# Patient Record
Sex: Female | Born: 1985 | Race: Black or African American | Hispanic: No | Marital: Single | Smoking: Never smoker
Health system: Southern US, Community
[De-identification: ages and names within clinical notes are randomized; demographics above are authoritative.]

## PROBLEM LIST (undated history)

## (undated) DIAGNOSIS — R51 Headache: Secondary | ICD-10-CM

## (undated) DIAGNOSIS — A6000 Herpesviral infection of urogenital system, unspecified: Secondary | ICD-10-CM

## (undated) DIAGNOSIS — T7840XA Allergy, unspecified, initial encounter: Secondary | ICD-10-CM

## (undated) DIAGNOSIS — D649 Anemia, unspecified: Secondary | ICD-10-CM

## (undated) HISTORY — DX: Allergy, unspecified, initial encounter: T78.40XA

## (undated) HISTORY — PX: ABDOMINAL SURGERY: SHX537

---

## 2012-12-29 LAB — OB RESULTS CONSOLE HEPATITIS B SURFACE ANTIGEN: Hepatitis B Surface Ag: NEGATIVE

## 2012-12-29 LAB — OB RESULTS CONSOLE ABO/RH: RH Type: POSITIVE

## 2012-12-29 LAB — OB RESULTS CONSOLE RUBELLA ANTIBODY, IGM: Rubella: IMMUNE

## 2012-12-29 LAB — OB RESULTS CONSOLE RPR: RPR: NONREACTIVE

## 2012-12-29 LAB — OB RESULTS CONSOLE HIV ANTIBODY (ROUTINE TESTING): HIV: NONREACTIVE

## 2013-07-06 ENCOUNTER — Inpatient Hospital Stay (HOSPITAL_COMMUNITY): Payer: Medicaid Other

## 2013-07-06 ENCOUNTER — Encounter (HOSPITAL_COMMUNITY): Payer: Self-pay | Admitting: *Deleted

## 2013-07-06 ENCOUNTER — Inpatient Hospital Stay (HOSPITAL_COMMUNITY)
Admission: AD | Admit: 2013-07-06 | Discharge: 2013-07-06 | Disposition: A | Discharge status: Home / Self Care | Payer: Medicaid Other | Source: Ambulatory Visit | Attending: Obstetrics and Gynecology | Admitting: Obstetrics and Gynecology

## 2013-07-06 DIAGNOSIS — E049 Nontoxic goiter, unspecified: Secondary | ICD-10-CM | POA: Diagnosis present

## 2013-07-06 DIAGNOSIS — O368390 Maternal care for abnormalities of the fetal heart rate or rhythm, unspecified trimester, not applicable or unspecified: Secondary | ICD-10-CM | POA: Insufficient documentation

## 2013-07-06 DIAGNOSIS — D649 Anemia, unspecified: Secondary | ICD-10-CM | POA: Diagnosis present

## 2013-07-06 DIAGNOSIS — B009 Herpesviral infection, unspecified: Secondary | ICD-10-CM | POA: Diagnosis present

## 2013-07-06 HISTORY — DX: Herpesviral infection of urogenital system, unspecified: A60.00

## 2013-07-06 HISTORY — DX: Headache: R51

## 2013-07-06 HISTORY — DX: Anemia, unspecified: D64.9

## 2013-07-06 LAB — URINALYSIS, ROUTINE W REFLEX MICROSCOPIC
Glucose, UA: NEGATIVE mg/dL
Hgb urine dipstick: NEGATIVE
Ketones, ur: NEGATIVE mg/dL
Protein, ur: NEGATIVE mg/dL
Specific Gravity, Urine: <1.005 — ABNORMAL LOW (ref 1.005–1.030)
Urobilinogen, UA: 0.2 mg/dL (ref 0.0–1.0)
pH: 6.5 (ref 5.0–8.0)

## 2013-07-06 LAB — OB RESULTS CONSOLE GBS: GBS: POSITIVE

## 2013-07-06 NOTE — MAU Note (Signed)
Sent from OB's office for further EFM due to fetal tachycardia

## 2013-08-07 ENCOUNTER — Encounter (HOSPITAL_COMMUNITY): Payer: Self-pay | Admitting: *Deleted

## 2013-08-07 ENCOUNTER — Inpatient Hospital Stay (HOSPITAL_COMMUNITY)
Admission: AD | Admit: 2013-08-07 | Discharge: 2013-08-10 | DRG: 765 | Disposition: A | Discharge status: Home / Self Care | Payer: Medicaid Other | Source: Ambulatory Visit | Attending: Obstetrics and Gynecology | Admitting: Obstetrics and Gynecology

## 2013-08-07 ENCOUNTER — Inpatient Hospital Stay (HOSPITAL_COMMUNITY): Payer: Medicaid Other

## 2013-08-07 DIAGNOSIS — O9902 Anemia complicating childbirth: Secondary | ICD-10-CM | POA: Diagnosis present

## 2013-08-07 DIAGNOSIS — O98519 Other viral diseases complicating pregnancy, unspecified trimester: Secondary | ICD-10-CM | POA: Diagnosis present

## 2013-08-07 DIAGNOSIS — A6000 Herpesviral infection of urogenital system, unspecified: Secondary | ICD-10-CM | POA: Diagnosis present

## 2013-08-07 DIAGNOSIS — O139 Gestational [pregnancy-induced] hypertension without significant proteinuria, unspecified trimester: Principal | ICD-10-CM | POA: Diagnosis present

## 2013-08-07 DIAGNOSIS — Z22330 Carrier of Group B streptococcus: Secondary | ICD-10-CM

## 2013-08-07 DIAGNOSIS — E669 Obesity, unspecified: Secondary | ICD-10-CM | POA: Diagnosis present

## 2013-08-07 DIAGNOSIS — O99892 Other specified diseases and conditions complicating childbirth: Secondary | ICD-10-CM | POA: Diagnosis present

## 2013-08-07 DIAGNOSIS — D649 Anemia, unspecified: Secondary | ICD-10-CM | POA: Diagnosis present

## 2013-08-07 LAB — CBC
HCT: 35 % — ABNORMAL LOW (ref 36.0–46.0)
MCH: 25 pg — ABNORMAL LOW (ref 26.0–34.0)
MCH: 24.9 pg — ABNORMAL LOW (ref 26.0–34.0)
MCHC: 32.6 g/dL (ref 30.0–36.0)
MCHC: 32.1 g/dL (ref 30.0–36.0)
MCV: 76.8 fL — ABNORMAL LOW (ref 78.0–100.0)
MCV: 77.7 fL — ABNORMAL LOW (ref 78.0–100.0)
Platelets: 180 10*3/uL (ref 150–400)
Platelets: 206 10*3/uL (ref 150–400)
RBC: 4.56 MIL/uL (ref 3.87–5.11)
RDW: 17.2 % — ABNORMAL HIGH (ref 11.5–15.5)
WBC: 10.6 10*3/uL — ABNORMAL HIGH (ref 4.0–10.5)

## 2013-08-07 LAB — COMPREHENSIVE METABOLIC PANEL
BUN: 9 mg/dL (ref 6–23)
CO2: 20 mEq/L (ref 19–32)
Calcium: 8.9 mg/dL (ref 8.4–10.5)
Chloride: 102 mEq/L (ref 96–112)
Creatinine, Ser: 0.63 mg/dL (ref 0.50–1.10)
GFR calc Af Amer: >90 mL/min (ref >90)
GFR calc non Af Amer: >90 mL/min (ref >90)
Glucose, Bld: 114 mg/dL — ABNORMAL HIGH (ref 70–99)
Total Bilirubin: 0.2 mg/dL — ABNORMAL LOW (ref 0.3–1.2)

## 2013-08-07 LAB — PROTEIN / CREATININE RATIO, URINE
Creatinine, Urine: 86.81 mg/dL
Protein Creatinine Ratio: 0.22 — ABNORMAL HIGH (ref 0.00–0.15)

## 2013-08-07 LAB — LACTATE DEHYDROGENASE: LDH: 180 U/L (ref 94–250)

## 2013-08-07 LAB — RPR: RPR Ser Ql: NONREACTIVE

## 2013-08-07 LAB — ABO/RH: ABO/RH(D): B POS

## 2013-08-07 LAB — URIC ACID: Uric Acid, Serum: 3.6 mg/dL (ref 2.4–7.0)

## 2013-08-07 LAB — TYPE AND SCREEN

## 2013-08-07 MED ORDER — IBUPROFEN 600 MG PO TABS: 600.0000 mg | ORAL_TABLET | Freq: Four times a day (QID) | ORAL | Status: DC | PRN

## 2013-08-07 MED ORDER — PHENYLEPHRINE 40 MCG/ML (10ML) SYRINGE FOR IV PUSH (FOR BLOOD PRESSURE SUPPORT): 80.0000 ug | PREFILLED_SYRINGE | INTRAVENOUS | Status: DC | PRN

## 2013-08-07 MED ORDER — CITRIC ACID-SODIUM CITRATE 334-500 MG/5ML PO SOLN
30.0000 mL | ORAL | Status: DC | PRN
  Administered 2013-08-08: 30 mL via ORAL
  Filled 2013-08-07: qty 15

## 2013-08-07 MED ORDER — PENICILLIN G POTASSIUM 5000000 UNITS IJ SOLR
2.5000 10*6.[IU] | INTRAMUSCULAR | Status: DC
  Administered 2013-08-07 – 2013-08-08 (×6): 2.5 10*6.[IU] via INTRAVENOUS
  Filled 2013-08-07 (×11): qty 2.5

## 2013-08-07 MED ORDER — OXYTOCIN 40 UNITS IN LACTATED RINGERS INFUSION - SIMPLE MED
62.5000 mL/h | INTRAVENOUS | Status: DC
  Filled 2013-08-07: qty 1000

## 2013-08-07 MED ORDER — ACETAMINOPHEN 325 MG PO TABS: 650.0000 mg | ORAL_TABLET | ORAL | Status: DC | PRN

## 2013-08-07 MED ORDER — EPHEDRINE 5 MG/ML INJ: 10.0000 mg | INTRAVENOUS | Status: DC | PRN

## 2013-08-07 MED ORDER — PROMETHAZINE HCL 25 MG/ML IJ SOLN
25.0000 mg | Freq: Once | INTRAMUSCULAR | Status: AC
  Administered 2013-08-07: 25 mg via INTRAVENOUS
  Filled 2013-08-07: qty 1

## 2013-08-07 MED ORDER — PHENYLEPHRINE 40 MCG/ML (10ML) SYRINGE FOR IV PUSH (FOR BLOOD PRESSURE SUPPORT)
80.0000 ug | PREFILLED_SYRINGE | INTRAVENOUS | Status: DC | PRN
  Filled 2013-08-07: qty 10

## 2013-08-07 MED ORDER — OXYTOCIN BOLUS FROM INFUSION: 500.0000 mL | INTRAVENOUS | Status: DC

## 2013-08-07 MED ORDER — ZOLPIDEM TARTRATE 5 MG PO TABS: 5.0000 mg | ORAL_TABLET | Freq: Every evening | ORAL | Status: DC | PRN

## 2013-08-07 MED ORDER — LACTATED RINGERS IV SOLN
INTRAVENOUS | Status: DC
  Administered 2013-08-07 – 2013-08-08 (×4): via INTRAVENOUS

## 2013-08-07 MED ORDER — OXYCODONE-ACETAMINOPHEN 5-325 MG PO TABS: 1.0000 | ORAL_TABLET | ORAL | Status: DC | PRN

## 2013-08-07 MED ORDER — NALBUPHINE SYRINGE 5 MG/0.5 ML
10.0000 mg | INJECTION | Freq: Once | INTRAMUSCULAR | Status: AC
  Administered 2013-08-07: 10 mg via INTRAVENOUS
  Filled 2013-08-07: qty 1

## 2013-08-07 MED ORDER — LACTATED RINGERS IV SOLN
500.0000 mL | Freq: Once | INTRAVENOUS | Status: AC
  Administered 2013-08-08: 500 mL via INTRAVENOUS

## 2013-08-07 MED ORDER — TERBUTALINE SULFATE 1 MG/ML IJ SOLN: 0.2500 mg | Freq: Once | INTRAMUSCULAR | Status: AC | PRN

## 2013-08-07 MED ORDER — FENTANYL 2.5 MCG/ML BUPIVACAINE 1/10 % EPIDURAL INFUSION (WH - ANES)
14.0000 mL/h | INTRAMUSCULAR | Status: DC | PRN
  Administered 2013-08-08 (×2): 14 mL/h via EPIDURAL
  Filled 2013-08-07 (×2): qty 125

## 2013-08-07 MED ORDER — LACTATED RINGERS IV SOLN
500.0000 mL | INTRAVENOUS | Status: DC | PRN
  Administered 2013-08-08: 500 mL via INTRAVENOUS

## 2013-08-07 MED ORDER — PENICILLIN G POTASSIUM 5000000 UNITS IJ SOLR
5.0000 10*6.[IU] | Freq: Once | INTRAVENOUS | Status: AC
  Administered 2013-08-07: 5 10*6.[IU] via INTRAVENOUS
  Filled 2013-08-07: qty 5

## 2013-08-07 MED ORDER — MISOPROSTOL 25 MCG QUARTER TABLET
25.0000 ug | ORAL_TABLET | ORAL | Status: DC | PRN
  Administered 2013-08-07 (×2): 25 ug via VAGINAL
  Filled 2013-08-07 (×2): qty 0.25

## 2013-08-07 MED ORDER — DIPHENHYDRAMINE HCL 50 MG/ML IJ SOLN
12.5000 mg | INTRAMUSCULAR | Status: DC | PRN
Start: 2013-08-07 — End: 2013-08-08

## 2013-08-07 MED ORDER — LIDOCAINE HCL (PF) 1 % IJ SOLN: 30.0000 mL | INTRAMUSCULAR | Status: DC | PRN

## 2013-08-07 MED ORDER — BUTORPHANOL TARTRATE 1 MG/ML IJ SOLN
2.0000 mg | INTRAMUSCULAR | Status: DC | PRN
  Administered 2013-08-07 (×2): 2 mg via INTRAVENOUS
  Administered 2013-08-07: 1 mg via INTRAVENOUS
  Filled 2013-08-07 (×3): qty 2

## 2013-08-07 MED ORDER — ONDANSETRON HCL 4 MG/2ML IJ SOLN: 4.0000 mg | Freq: Four times a day (QID) | INTRAMUSCULAR | Status: DC | PRN

## 2013-08-07 MED ORDER — EPHEDRINE 5 MG/ML INJ
10.0000 mg | INTRAVENOUS | Status: DC | PRN
  Filled 2013-08-07: qty 4

## 2013-08-07 MED ORDER — CEFAZOLIN SODIUM 1-5 GM-% IV SOLN: 1.0000 g | Freq: Three times a day (TID) | INTRAVENOUS | Status: DC

## 2013-08-07 MED ORDER — CEFAZOLIN SODIUM-DEXTROSE 2-3 GM-% IV SOLR: 2.0000 g | Freq: Once | INTRAVENOUS | Status: DC

## 2013-08-07 NOTE — Progress Notes (Signed)
  Subjective: Pt is having trouble coping with UCs at this point but would like to delay epidural.  Pt requests IV pain medication.  Objective: BP 146/91  Pulse 93  Temp(Src) 98.7 F (37.1 C) (Oral)  Resp 18  Ht 5\' 5"  (1.651 m)  Wt 211 lb (95.709 kg)  BMI 35.11 kg/m2  SpO2 100%      FHT:  Cat I UC:   irregular, every 2-7 minutes  SVE:   Dilation: 1 Effacement (%): 50 Station: -3 Exam by:: Dr. Stefano Gaul   Assessment / Plan:  Labor: IOL for [facility]; Cytotec given at 0956 and 1359; Foley bulb placed at 1800; Plan to start Pitocin in the am Preeclampsia: BP range (0700 - 1900) 137 - 153 / 81 - 103 Fetal Wellbeing: Cat I Pain Control: Stadol given at 1349 and 1722; Rx'd Nubain 10 mg and Phenergan 25 mg for rest; pt may request epidural after I/D: GBS pos; Pen G prophylaxis initiated at 0941 this am; Intact; Afebrile Anticipated MOD: SVD   Allizon Woznick 08/07/2013, 8:07 PM

## 2013-08-07 NOTE — MAU Note (Signed)
Stronger contractions started around  6pm, 5 mins apart since 9 pm. Denies vaginal bleeding and leaking of fluid.

## 2013-08-07 NOTE — H&P (Addendum)
Admission History and Physical Exam for an Obstetrics Patient  Kristine Mason is a 27 y.o. female, G1P0, at [redacted]w[redacted]d gestation, who presents for evaluation of uterine contractions. She has been followed at the [facility] and Gynecology division of [facility].  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 She is currently taking Valtrex. She denies any symptoms or signs of infection. The patient denies headaches, blurred vision, and right upper quadrant tenderness. See history below.  OB History   Grav Para Term Preterm Abortions TAB SAB Ect Mult Living   1               Past Medical History  Diagnosis Date  . Anemia   . Headache(784.0)   . Herpes genitalia     Prescriptions prior to admission  Medication Sig Dispense Refill  . Iron-FA-B Cmp-C-Biot-Probiotic (FUSION PLUS PO) Take 1 tablet by mouth daily.      . Prenatal Vit-Min-FA-Fish Oil ([employer] PRENATAL GUMMY PO) Take 2 tablets by mouth daily.      . ranitidine (ZANTAC) 75 MG tablet Take 75 mg by mouth daily as needed for heartburn.      . valACYclovir (VALTREX) 500 MG tablet Take 500 mg by mouth daily.        Past Surgical History  Procedure Laterality Date  . Abdominal surgery      Allergies  Allergen Reactions  . Cleocin [Clindamycin Hcl] Rash    Family History: family history includes Cancer in her paternal grandmother; Diabetes in her maternal grandfather; Heart disease in her maternal grandmother and mother; Hypertension in her maternal aunt and maternal grandmother.  Social History:  reports that she has never smoked. She does not have any smokeless tobacco history on file. She reports that she does not drink alcohol or use illicit drugs.  Review of systems: Normal pregnancy complaints.  Admission Physical Exam:  Dilation: Fingertip Effacement (%): Thick Station: -3 Exam by:: L. Munford RN Body mass index is 35.11 kg/(m^2).  Blood pressure 139/87, pulse 89,  temperature 99 F (37.2 C), temperature source Oral, resp. rate 18, height 5\' 5"  (1.651 m), weight 211 lb (95.709 kg), SpO2 100.00%.  HEENT:                 Within normal limits Chest:                   Clear Heart:                    Regular rate and rhythm Abdomen:             Gravid and nontender Extremities:          Grossly normal Neurologic exam: Grossly normal Pelvic exam:         Cervix: Fingertip Speculum exam: The vulva, vagina, and cervix were carefully inspected. No lesions are appreciated.  Prenatal labs: ABO, Rh:             B/Positive/-- (05/09 0000) HBsAg:                 Negative (05/09 0000)  HIV:                       Non-reactive (05/09 0000)  GBS:                       positive Antibody:  Negative (05/09 0000) Rubella:                  immune RPR:                    Nonreactive (05/09 0000)   Results for orders placed during the hospital encounter of 08/07/13 (from the past 24 hour(s))  CBC     Status: Abnormal   Collection Time    08/07/13  3:17 AM      Result Value Range   WBC 10.6 (*) 4.0 - 10.5 K/uL   RBC 4.56  3.87 - 5.11 MIL/uL   Hemoglobin 11.4 (*) 12.0 - 15.0 g/dL   HCT 96.2 (*) 95.2 - 84.1 %   MCV 76.8 (*) 78.0 - 100.0 fL   MCH 25.0 (*) 26.0 - 34.0 pg   MCHC 32.6  30.0 - 36.0 g/dL   RDW 32.4 (*) 40.1 - 02.7 %   Platelets 180  150 - 400 K/uL  COMPREHENSIVE METABOLIC PANEL     Status: Abnormal   Collection Time    08/07/13  3:17 AM      Result Value Range   Sodium 134 (*) 135 - 145 mEq/L   Potassium 3.9  3.5 - 5.1 mEq/L   Chloride 102  96 - 112 mEq/L   CO2 20  19 - 32 mEq/L   Glucose, Bld 114 (*) 70 - 99 mg/dL   BUN 9  6 - 23 mg/dL   Creatinine, Ser 2.53  0.50 - 1.10 mg/dL   Calcium 8.9  8.4 - 66.4 mg/dL   Total Protein 6.4  6.0 - 8.3 g/dL   Albumin 2.7 (*) 3.5 - 5.2 g/dL   AST 18  0 - 37 U/L   ALT 22  0 - 35 U/L   Alkaline Phosphatase 97  39 - 117 U/L   Total Bilirubin 0.2 (*) 0.3 - 1.2 mg/dL   GFR calc non Af Amer >90  >90  mL/min   GFR calc Af Amer >90  >90 mL/min  URIC ACID     Status: None   Collection Time    08/07/13  3:17 AM      Result Value Range   Uric Acid, Serum 3.6  2.4 - 7.0 mg/dL  LACTATE DEHYDROGENASE     Status: None   Collection Time    08/07/13  3:17 AM      Result Value Range   LDH 180  94 - 250 U/L  PROTEIN / CREATININE RATIO, URINE     Status: Abnormal   Collection Time    08/07/13  3:35 AM      Result Value Range   Creatinine, Urine 86.81     Total Protein, Urine 18.8     PROTEIN CREATININE RATIO 0.22 (*) 0.00 - 0.15  TYPE AND SCREEN     Status: None   Collection Time    08/07/13  8:35 AM      Result Value Range   ABO/RH(D) B POS     Antibody Screen NEG     Sample Expiration 08/10/2013    ABO/RH     Status: None   Collection Time    08/07/13  8:35 AM      Result Value Range   ABO/RH(D) B POS    CBC     Status: Abnormal   Collection Time    08/07/13  9:30 AM      Result Value Range   WBC 10.2  4.0 - 10.5 K/uL   RBC 4.70  3.87 - 5.11 MIL/uL   Hemoglobin 11.7 (*) 12.0 - 15.0 g/dL   HCT 16.1  09.6 - 04.5 %   MCV 77.7 (*) 78.0 - 100.0 fL   MCH 24.9 (*) 26.0 - 34.0 pg   MCHC 32.1  30.0 - 36.0 g/dL   RDW 40.9 (*) 81.1 - 91.4 %   Platelets 206  150 - 400 K/uL  RPR     Status: None   Collection Time    08/07/13  9:30 AM      Result Value Range   RPR NON REACTIVE  NON REACTIVE        Prenatal Transfer Tool  Maternal Diabetes: No Genetic Screening: Normal Maternal Ultrasounds/Referrals: Normal Fetal Ultrasounds or other Referrals:  None Maternal Substance Abuse:  No Significant Maternal Medications:  Valtrex Significant Maternal Lab Results:  Positive beta strep  Assessment:  [redacted]w[redacted]d gestation  Pregnancy-induced hypertension. The patient was observed maternity admissions for several hours. She had 2 blood pressures greater than 140/90 that were greater than 4 hours apart. The decision was made to admit the patient for induction because she is already past her  due date with a new diagnosis of PIH.  Herpesvirus (no lesions)   positive beta strep  Anemia  Obesity  Plan:  The patient was admitted for induction. She was given several doses of vaginal Cytotec. There was minimal change of her cervix. A Foley bulb was inserted and we will plan to augment with Pitocin in the morning.  Fetal heart rate tracing is a category 1.   Ashely Joshua V 08/07/2013, 8:55 AM

## 2013-08-08 ENCOUNTER — Encounter (HOSPITAL_COMMUNITY): Payer: Self-pay | Admitting: *Deleted

## 2013-08-08 ENCOUNTER — Encounter (HOSPITAL_COMMUNITY): Payer: Medicaid Other | Admitting: Anesthesiology

## 2013-08-08 ENCOUNTER — Encounter (HOSPITAL_COMMUNITY)
Admission: AD | Disposition: A | Discharge status: Home / Self Care | Payer: Self-pay | Source: Ambulatory Visit | Attending: Obstetrics and Gynecology

## 2013-08-08 ENCOUNTER — Inpatient Hospital Stay (HOSPITAL_COMMUNITY): Payer: Medicaid Other | Admitting: Anesthesiology

## 2013-08-08 LAB — CBC
HCT: 35.3 % — ABNORMAL LOW (ref 36.0–46.0)
Hemoglobin: 11.7 g/dL — ABNORMAL LOW (ref 12.0–15.0)
MCH: 25.7 pg — ABNORMAL LOW (ref 26.0–34.0)
MCHC: 33.1 g/dL (ref 30.0–36.0)
MCV: 77.4 fL — ABNORMAL LOW (ref 78.0–100.0)
RBC: 4.56 MIL/uL (ref 3.87–5.11)

## 2013-08-08 SURGERY — Surgical Case
Anesthesia: Epidural | Site: Abdomen

## 2013-08-08 MED ORDER — LIDOCAINE-EPINEPHRINE (PF) 2 %-1:200000 IJ SOLN
INTRAMUSCULAR | Status: AC
  Filled 2013-08-08: qty 20

## 2013-08-08 MED ORDER — BUPIVACAINE HCL (PF) 0.25 % IJ SOLN
INTRAMUSCULAR | Status: AC
  Filled 2013-08-08: qty 10

## 2013-08-08 MED ORDER — MEPERIDINE HCL 25 MG/ML IJ SOLN: 6.2500 mg | INTRAMUSCULAR | Status: DC | PRN

## 2013-08-08 MED ORDER — FENTANYL CITRATE 0.05 MG/ML IJ SOLN
INTRAMUSCULAR | Status: DC | PRN
  Administered 2013-08-08: 100 ug via INTRAVENOUS

## 2013-08-08 MED ORDER — ONDANSETRON HCL 4 MG/2ML IJ SOLN: 4.0000 mg | Freq: Three times a day (TID) | INTRAMUSCULAR | Status: DC | PRN

## 2013-08-08 MED ORDER — SODIUM BICARBONATE 8.4 % IV SOLN
INTRAVENOUS | Status: AC
  Filled 2013-08-08: qty 50

## 2013-08-08 MED ORDER — IBUPROFEN 600 MG PO TABS
600.0000 mg | ORAL_TABLET | Freq: Four times a day (QID) | ORAL | Status: DC
  Administered 2013-08-08 – 2013-08-10 (×7): 600 mg via ORAL
  Filled 2013-08-08 (×8): qty 1

## 2013-08-08 MED ORDER — BUPIVACAINE HCL (PF) 0.25 % IJ SOLN
INTRAMUSCULAR | Status: DC | PRN
  Administered 2013-08-08: 20 mL

## 2013-08-08 MED ORDER — TERBUTALINE SULFATE 1 MG/ML IJ SOLN: 0.2500 mg | Freq: Once | INTRAMUSCULAR | Status: DC | PRN

## 2013-08-08 MED ORDER — MORPHINE SULFATE 0.5 MG/ML IJ SOLN
INTRAMUSCULAR | Status: AC
  Filled 2013-08-08: qty 10

## 2013-08-08 MED ORDER — DIPHENHYDRAMINE HCL 25 MG PO CAPS
25.0000 mg | ORAL_CAPSULE | ORAL | Status: DC | PRN
  Filled 2013-08-08: qty 1

## 2013-08-08 MED ORDER — BUPIVACAINE HCL (PF) 0.25 % IJ SOLN: INTRAMUSCULAR | Status: DC | PRN

## 2013-08-08 MED ORDER — LACTATED RINGERS IV SOLN
INTRAVENOUS | Status: DC | PRN
  Administered 2013-08-08: 15:00:00 via INTRAVENOUS

## 2013-08-08 MED ORDER — METOCLOPRAMIDE HCL 5 MG/ML IJ SOLN: 10.0000 mg | Freq: Three times a day (TID) | INTRAMUSCULAR | Status: DC | PRN

## 2013-08-08 MED ORDER — NALOXONE HCL 1 MG/ML IJ SOLN
1.0000 ug/kg/h | INTRAVENOUS | Status: DC | PRN
  Filled 2013-08-08: qty 2

## 2013-08-08 MED ORDER — DIPHENHYDRAMINE HCL 50 MG/ML IJ SOLN: 25.0000 mg | INTRAMUSCULAR | Status: DC | PRN

## 2013-08-08 MED ORDER — SODIUM CHLORIDE 0.9 % IJ SOLN: 3.0000 mL | INTRAMUSCULAR | Status: DC | PRN

## 2013-08-08 MED ORDER — OXYTOCIN 10 UNIT/ML IJ SOLN
40.0000 [IU] | INTRAVENOUS | Status: DC | PRN
  Administered 2013-08-08: 40 [IU] via INTRAVENOUS

## 2013-08-08 MED ORDER — FERROUS SULFATE 325 (65 FE) MG PO TABS
325.0000 mg | ORAL_TABLET | Freq: Two times a day (BID) | ORAL | Status: DC
  Administered 2013-08-09 – 2013-08-10 (×3): 325 mg via ORAL
  Filled 2013-08-08 (×3): qty 1

## 2013-08-08 MED ORDER — METHYLERGONOVINE MALEATE 0.2 MG PO TABS: 0.2000 mg | ORAL_TABLET | ORAL | Status: DC | PRN

## 2013-08-08 MED ORDER — ONDANSETRON HCL 4 MG/2ML IJ SOLN: 4.0000 mg | INTRAMUSCULAR | Status: DC | PRN

## 2013-08-08 MED ORDER — SIMETHICONE 80 MG PO CHEW
80.0000 mg | CHEWABLE_TABLET | ORAL | Status: DC | PRN
  Administered 2013-08-09: 80 mg via ORAL

## 2013-08-08 MED ORDER — CEFAZOLIN SODIUM-DEXTROSE 2-3 GM-% IV SOLR
INTRAVENOUS | Status: DC | PRN
  Administered 2013-08-08: 2 g via INTRAVENOUS

## 2013-08-08 MED ORDER — FENTANYL CITRATE 0.05 MG/ML IJ SOLN
INTRAMUSCULAR | Status: AC
  Filled 2013-08-08: qty 2

## 2013-08-08 MED ORDER — SODIUM BICARBONATE 8.4 % IV SOLN
INTRAVENOUS | Status: DC | PRN
  Administered 2013-08-08 (×4): 5 mL via EPIDURAL
  Administered 2013-08-08: 10 mL via EPIDURAL

## 2013-08-08 MED ORDER — MENTHOL 3 MG MT LOZG: 1.0000 | LOZENGE | OROMUCOSAL | Status: DC | PRN

## 2013-08-08 MED ORDER — LANOLIN HYDROUS EX OINT: 1.0000 "application " | TOPICAL_OINTMENT | CUTANEOUS | Status: DC | PRN

## 2013-08-08 MED ORDER — MORPHINE SULFATE (PF) 0.5 MG/ML IJ SOLN
INTRAMUSCULAR | Status: DC | PRN
  Administered 2013-08-08: 4 mg via EPIDURAL
  Administered 2013-08-08: 1 mg via INTRAVENOUS

## 2013-08-08 MED ORDER — WITCH HAZEL-GLYCERIN EX PADS: 1.0000 "application " | MEDICATED_PAD | CUTANEOUS | Status: DC | PRN

## 2013-08-08 MED ORDER — OXYTOCIN 40 UNITS IN LACTATED RINGERS INFUSION - SIMPLE MED
1.0000 m[IU]/min | INTRAVENOUS | Status: DC
  Administered 2013-08-08: 2 m[IU]/min via INTRAVENOUS

## 2013-08-08 MED ORDER — DIBUCAINE 1 % RE OINT: 1.0000 "application " | TOPICAL_OINTMENT | RECTAL | Status: DC | PRN

## 2013-08-08 MED ORDER — METHYLERGONOVINE MALEATE 0.2 MG/ML IJ SOLN: 0.2000 mg | INTRAMUSCULAR | Status: DC | PRN

## 2013-08-08 MED ORDER — BUTORPHANOL TARTRATE 1 MG/ML IJ SOLN
1.0000 mg | Freq: Once | INTRAMUSCULAR | Status: AC
  Administered 2013-08-08: 1 mg via INTRAVENOUS
  Filled 2013-08-08: qty 1

## 2013-08-08 MED ORDER — TETANUS-DIPHTH-ACELL PERTUSSIS 5-2.5-18.5 LF-MCG/0.5 IM SUSP: 0.5000 mL | Freq: Once | INTRAMUSCULAR | Status: DC

## 2013-08-08 MED ORDER — LACTATED RINGERS IV SOLN
INTRAVENOUS | Status: DC
  Administered 2013-08-08: 500 mL via INTRAUTERINE

## 2013-08-08 MED ORDER — LACTATED RINGERS IV SOLN
INTRAVENOUS | Status: DC
  Administered 2013-08-09: 01:00:00 via INTRAVENOUS

## 2013-08-08 MED ORDER — DIPHENHYDRAMINE HCL 25 MG PO CAPS
25.0000 mg | ORAL_CAPSULE | Freq: Four times a day (QID) | ORAL | Status: DC | PRN
  Administered 2013-08-09: 25 mg via ORAL

## 2013-08-08 MED ORDER — NALBUPHINE HCL 10 MG/ML IJ SOLN
5.0000 mg | INTRAMUSCULAR | Status: DC | PRN
  Filled 2013-08-08: qty 1

## 2013-08-08 MED ORDER — ONDANSETRON HCL 4 MG/2ML IJ SOLN
INTRAMUSCULAR | Status: AC
  Filled 2013-08-08: qty 2

## 2013-08-08 MED ORDER — SIMETHICONE 80 MG PO CHEW
80.0000 mg | CHEWABLE_TABLET | ORAL | Status: DC
  Administered 2013-08-08: 80 mg via ORAL
  Filled 2013-08-08 (×2): qty 1

## 2013-08-08 MED ORDER — FENTANYL CITRATE 0.05 MG/ML IJ SOLN: 25.0000 ug | INTRAMUSCULAR | Status: DC | PRN

## 2013-08-08 MED ORDER — CEFAZOLIN SODIUM-DEXTROSE 2-3 GM-% IV SOLR
INTRAVENOUS | Status: AC
  Filled 2013-08-08: qty 50

## 2013-08-08 MED ORDER — OXYTOCIN 10 UNIT/ML IJ SOLN
INTRAMUSCULAR | Status: AC
  Filled 2013-08-08: qty 4

## 2013-08-08 MED ORDER — ONDANSETRON HCL 4 MG PO TABS
4.0000 mg | ORAL_TABLET | ORAL | Status: DC | PRN
  Administered 2013-08-09: 4 mg via ORAL
  Filled 2013-08-08: qty 1

## 2013-08-08 MED ORDER — MEPERIDINE HCL 25 MG/ML IJ SOLN
INTRAMUSCULAR | Status: DC | PRN
  Administered 2013-08-08: 25 mg via INTRAVENOUS

## 2013-08-08 MED ORDER — PROMETHAZINE HCL 25 MG/ML IJ SOLN: 25.0000 mg | Freq: Once | INTRAMUSCULAR | Status: DC

## 2013-08-08 MED ORDER — MEASLES, MUMPS & RUBELLA VAC ~~LOC~~ INJ
0.5000 mL | INJECTION | Freq: Once | SUBCUTANEOUS | Status: DC
  Filled 2013-08-08: qty 0.5

## 2013-08-08 MED ORDER — MEPERIDINE HCL 25 MG/ML IJ SOLN
INTRAMUSCULAR | Status: AC
  Filled 2013-08-08: qty 1

## 2013-08-08 MED ORDER — KETOROLAC TROMETHAMINE 30 MG/ML IJ SOLN: 30.0000 mg | Freq: Four times a day (QID) | INTRAMUSCULAR | Status: DC | PRN

## 2013-08-08 MED ORDER — NALOXONE HCL 0.4 MG/ML IJ SOLN: 0.4000 mg | INTRAMUSCULAR | Status: DC | PRN

## 2013-08-08 MED ORDER — ONDANSETRON HCL 4 MG/2ML IJ SOLN
INTRAMUSCULAR | Status: DC | PRN
  Administered 2013-08-08: 4 mg via INTRAVENOUS

## 2013-08-08 MED ORDER — KETOROLAC TROMETHAMINE 30 MG/ML IJ SOLN
30.0000 mg | Freq: Four times a day (QID) | INTRAMUSCULAR | Status: DC | PRN
  Administered 2013-08-08: 30 mg via INTRAVENOUS

## 2013-08-08 MED ORDER — KETOROLAC TROMETHAMINE 30 MG/ML IJ SOLN
INTRAMUSCULAR | Status: AC
  Filled 2013-08-08: qty 1

## 2013-08-08 MED ORDER — LIDOCAINE HCL (PF) 1 % IJ SOLN
INTRAMUSCULAR | Status: DC | PRN
  Administered 2013-08-08 (×4): 4 mL

## 2013-08-08 MED ORDER — SIMETHICONE 80 MG PO CHEW
80.0000 mg | CHEWABLE_TABLET | Freq: Three times a day (TID) | ORAL | Status: DC
  Administered 2013-08-09 – 2013-08-10 (×5): 80 mg via ORAL
  Filled 2013-08-08 (×5): qty 1

## 2013-08-08 MED ORDER — OXYTOCIN 40 UNITS IN LACTATED RINGERS INFUSION - SIMPLE MED: 62.5000 mL/h | INTRAVENOUS | Status: AC

## 2013-08-08 MED ORDER — SENNOSIDES-DOCUSATE SODIUM 8.6-50 MG PO TABS
2.0000 | ORAL_TABLET | ORAL | Status: DC
  Administered 2013-08-08 – 2013-08-09 (×2): 2 via ORAL
  Filled 2013-08-08 (×2): qty 2

## 2013-08-08 MED ORDER — ZOLPIDEM TARTRATE 5 MG PO TABS: 5.0000 mg | ORAL_TABLET | Freq: Every evening | ORAL | Status: DC | PRN

## 2013-08-08 MED ORDER — NALBUPHINE SYRINGE 5 MG/0.5 ML: 10.0000 mg | INJECTION | Freq: Once | INTRAMUSCULAR | Status: DC

## 2013-08-08 MED ORDER — SCOPOLAMINE 1 MG/3DAYS TD PT72
1.0000 | MEDICATED_PATCH | Freq: Once | TRANSDERMAL | Status: DC
  Administered 2013-08-08: 1.5 mg via TRANSDERMAL

## 2013-08-08 MED ORDER — SCOPOLAMINE 1 MG/3DAYS TD PT72
MEDICATED_PATCH | TRANSDERMAL | Status: AC
  Filled 2013-08-08: qty 1

## 2013-08-08 MED ORDER — OXYCODONE-ACETAMINOPHEN 5-325 MG PO TABS
1.0000 | ORAL_TABLET | ORAL | Status: DC | PRN
  Administered 2013-08-09 – 2013-08-10 (×4): 1 via ORAL
  Filled 2013-08-08 (×3): qty 1
  Filled 2013-08-08: qty 2

## 2013-08-08 MED ORDER — DIPHENHYDRAMINE HCL 50 MG/ML IJ SOLN
12.5000 mg | INTRAMUSCULAR | Status: DC | PRN
  Administered 2013-08-09 (×2): 12.5 mg via INTRAVENOUS
  Filled 2013-08-08 (×2): qty 1

## 2013-08-08 MED ORDER — PRENATAL MULTIVITAMIN CH
1.0000 | ORAL_TABLET | Freq: Every day | ORAL | Status: DC
  Administered 2013-08-09 – 2013-08-10 (×2): 1 via ORAL
  Filled 2013-08-08 (×2): qty 1

## 2013-08-08 SURGICAL SUPPLY — 35 items
BENZOIN TINCTURE PRP APPL 2/3 (GAUZE/BANDAGES/DRESSINGS) ×2 IMPLANT
BOOTIES KNEE HIGH SLOAN (MISCELLANEOUS) ×4 IMPLANT
CLAMP CORD UMBIL (MISCELLANEOUS) IMPLANT
CLOTH BEACON ORANGE TIMEOUT ST (SAFETY) ×2 IMPLANT
DRAIN JACKSON PRT FLT 10 (DRAIN) IMPLANT
DRAPE LG THREE QUARTER DISP (DRAPES) IMPLANT
DRSG OPSITE POSTOP 4X10 (GAUZE/BANDAGES/DRESSINGS) ×2 IMPLANT
DURAPREP 26ML APPLICATOR (WOUND CARE) ×2 IMPLANT
ELECT REM PT RETURN 9FT ADLT (ELECTROSURGICAL) ×2
ELECTRODE REM PT RTRN 9FT ADLT (ELECTROSURGICAL) ×1 IMPLANT
EVACUATOR SILICONE 100CC (DRAIN) IMPLANT
EXTRACTOR VACUUM M CUP 4 TUBE (SUCTIONS) IMPLANT
GLOVE BIOGEL PI IND STRL 7.0 (GLOVE) ×1 IMPLANT
GLOVE BIOGEL PI INDICATOR 7.0 (GLOVE) ×1
GLOVE ECLIPSE 6.5 STRL STRAW (GLOVE) ×2 IMPLANT
GOWN PREVENTION PLUS XLARGE (GOWN DISPOSABLE) ×2 IMPLANT
GOWN STRL REIN XL XLG (GOWN DISPOSABLE) ×2 IMPLANT
KIT ABG SYR 3ML LUER SLIP (SYRINGE) IMPLANT
NEEDLE HYPO 22GX1.5 SAFETY (NEEDLE) ×2 IMPLANT
NEEDLE HYPO 25X5/8 SAFETYGLIDE (NEEDLE) IMPLANT
NS IRRIG 1000ML POUR BTL (IV SOLUTION) ×4 IMPLANT
PACK C SECTION WH (CUSTOM PROCEDURE TRAY) ×2 IMPLANT
PAD OB MATERNITY 4.3X12.25 (PERSONAL CARE ITEMS) ×2 IMPLANT
RTRCTR C-SECT PINK 25CM LRG (MISCELLANEOUS) ×2 IMPLANT
STRIP CLOSURE SKIN 1/2X4 (GAUZE/BANDAGES/DRESSINGS) ×2 IMPLANT
SUT CHROMIC GUT AB #0 18 (SUTURE) IMPLANT
SUT MNCRL AB 3-0 PS2 27 (SUTURE) ×2 IMPLANT
SUT SILK 2 0 FSL 18 (SUTURE) IMPLANT
SUT VIC AB 0 CTX 36 (SUTURE) ×2
SUT VIC AB 0 CTX36XBRD ANBCTRL (SUTURE) ×2 IMPLANT
SUT VIC AB 1 CT1 36 (SUTURE) ×4 IMPLANT
SYR 20CC LL (SYRINGE) ×2 IMPLANT
TOWEL OR 17X24 6PK STRL BLUE (TOWEL DISPOSABLE) ×2 IMPLANT
TRAY FOLEY CATH 14FR (SET/KITS/TRAYS/PACK) IMPLANT
WATER STERILE IRR 1000ML POUR (IV SOLUTION) IMPLANT

## 2013-08-08 NOTE — Anesthesia Procedure Notes (Signed)
Epidural Patient location during procedure: OB Start time: 08/08/2013 4:49 AM  Staffing Performed by: anesthesiologist   Preanesthetic Checklist Completed: patient identified, site marked, surgical consent, pre-op evaluation, timeout performed, IV checked, risks and benefits discussed and monitors and equipment checked  Epidural Patient position: sitting Prep: site prepped and draped and DuraPrep Patient monitoring: continuous pulse ox and blood pressure Approach: midline Injection technique: LOR air  Needle:  Needle type: Tuohy  Needle gauge: 17 G Needle length: 9 cm and 9 Needle insertion depth: 6 cm Catheter type: closed end flexible Catheter size: 19 Gauge Catheter at skin depth: 11 cm Test dose: negative  Assessment Events: blood not aspirated, injection not painful, no injection resistance, negative IV test and no paresthesia  Additional Notes Discussed risk of headache, infection, bleeding, nerve injury and failed or incomplete block.  Patient voices understanding and wishes to proceed. Epidural placed easily on first attempt.  No paresthesia.  Patient tolerated procedure well with no apparent complications.  Jasmine December, MDReason for block:procedure for pain

## 2013-08-08 NOTE — Transfer of Care (Signed)
Immediate Anesthesia Transfer of Care Note  Patient: Kristine Mason  Procedure(s) Performed: Procedure(s): CESAREAN SECTION (N/A)  Patient Location: PACU  Anesthesia Type:Epidural  Level of Consciousness: awake, alert  and oriented  Airway & Oxygen Therapy: Patient Spontanous Breathing  Post-op Assessment: Report given to PACU RN and Post -op Vital signs reviewed and stable  Post vital signs: Reviewed and stable  Complications: No apparent anesthesia complications

## 2013-08-08 NOTE — Progress Notes (Signed)
Delayed entry from 12:30 bedside encounter  After amnioinfusion and 2 hours of category I tracings, Pitocin was started and increased until adequate MVUs where baby started having variable decelerations again and decreased variability. VE remained unchanged at 2/50/-3. Findings reviewed with patient and husband and recommendations to proceed with cesarean section.   Cesarean section reviewed with pt with R&B including but not limited to:  bleeding, infection, injury to other organs. Low transverse approach planned which will allow vaginal delivery with future pregnancies. Should a vertical incision or inverted T be needed, patient is aware that repeat cesarean sections would be recommended in the future. Expected hospital stay and recovery also discussed.  Patient is agreeable to proceed. Pitocin was discontinued.  Procedure was delayed by [facility] with another patient. Tracings were constantly monitored during the delay.

## 2013-08-08 NOTE — Op Note (Signed)
Preoperative diagnosis: Intrauterine pregnancy at 40 weeks and 6 days, pregnancy-induced hypertension and Category II fetal tracings  Post operative diagnosis: Same  Anesthesia: Epidural  Anesthesiologist: Dr. Malen Gauze  Procedure: Primary low transverse cesarean section  Surgeon: Dr. Dois Davenport Loralie Malta  Assistant: none  Estimated blood loss: 600 cc  Procedure:  After being informed of the planned procedure and possible complications including bleeding, infection, injury to other organs, informed consent is obtained. The patient is taken to OR #2 and given pre-existing epidural anesthesia was optimized  without complication. She is placed in the dorsal decubitus position with the pelvis tilted to the left. She is then prepped and draped in a sterile fashion. A Foley catheter is inserted in her bladder.  After assessing adequate level of anesthesia, we infiltrate the suprapubic area with 20 cc of Marcaine 0.25 and perform a Pfannenstiel incision which is brought down sharply to the fascia. The fascia is entered in a low transverse fashion. Linea alba is dissected. Peritoneum is entered in a midline fashion. An Alexis retractor is easily positioned.   The myometrium is then entered in a low transverse fashion, 2 cm above the vesico-uterine junction ; first with knife and then extended bluntly. Amniotic fluid is lightly meconium stained. We assist the birth of a female  infant in vertex presentation. Mouth and nose are suctioned. The baby is delivered. The cord is clamped and sectioned. The baby is given to the neonatologist present in the room. We not 2 shoulder cord loops.  10 cc of blood is drawn from the umbilical vein.The placenta is allowed to deliver spontaneously. It is complete and the cord has 3 vessels. Uterine revision is negative.  We proceed with closure of the myometrium in 2 layers: First with a running locked suture of 0 Vicryl, then with a Lembert suture of 0 Vicryl imbricating the  first one. Hemostasis is completed with cauterization on peritoneal edges.  Both paracolic gutters are cleaned. Both tubes and ovaries are assessed and normal. The pelvis is profusely irrigated with warm saline to confirm a satisfactory hemostasis.  Retractors and sponges are removed. Under fascia hemostasis is completed with cauterization. The fascia is then closed with 2 running sutures of 0 Vicryl meeting midline. The wound is irrigated with warm saline and hemostasis is completed with cauterization. The skin is closed with a subcuticular suture of 3-0 Monocryl and Steri-Strips.  Instrument and sponge count is complete x2. Estimated blood loss is 600 cc.  The procedure is well tolerated by the patient who is taken to recovery room in a well and stable condition.  female baby named Ava was born at 15:47 and received an Apgar of 8  at 1 minute and 9 at 5 minutes.    Specimen: Placenta sent to [facility] & [facility]   Kristine Mason A MD 12/17/20144:41 PM

## 2013-08-08 NOTE — Anesthesia Postprocedure Evaluation (Signed)
  Anesthesia Post-op Note  Patient: Field seismologist  Procedure(s) Performed: Procedure(s): CESAREAN SECTION (N/A)  Patient Location: PACU  Anesthesia Type:Spinal  Level of Consciousness: awake, alert  and oriented  Airway and Oxygen Therapy: Patient Spontanous Breathing  Post-op Pain: none  Post-op Assessment: Post-op Vital signs reviewed, Patient's Cardiovascular Status Stable, Respiratory Function Stable, Patent Airway, No signs of Nausea or vomiting, Pain level controlled, No headache and No backache  Post-op Vital Signs: Reviewed and stable  Complications: No apparent anesthesia complications

## 2013-08-08 NOTE — Progress Notes (Signed)
  Subjective: Pt reporting stronger more frequent UCs. Pt reports LOF.  Objective: BP 116/71  Pulse 69  Temp(Src) 98.6 F (37 C) (Oral)  Resp 18  Ht 5\' 5"  (1.651 m)  Wt 211 lb (95.709 kg)  BMI 35.11 kg/m2  SpO2 98%      FHT:  Cat II - position change with resolution UC:   Irregular  SVE:   Dilation: 2 Effacement (%): 50 Station: -2 Exam by:: Ulyses Southward, RN  Assessment / Plan:  Labor: IOL for PIH; Foley bulb still in place   Preeclampsia: CTO BP  Fetal Wellbeing: Cat II Pain Control: Nubain and Phenergan again, does not want epidural until just before Pitocin   I/D: GBS pos; SROM with mec around 0000; Afebrile  Anticipated MOD: SVD   Eduard Penkala 08/08/2013, 2:09 AM

## 2013-08-08 NOTE — Progress Notes (Signed)
  Subjective: Called to [facility] for variables.  Objective: BP 116/71  Pulse 69  Temp(Src) 98.6 F (37 C) (Oral)  Resp 18  Ht 5\' 5"  (1.651 m)  Wt 211 lb (95.709 kg)  BMI 35.11 kg/m2  SpO2 98%      FHT:  Cat II - position changes UC:   irregular  SVE:   Dilation: 1 Effacement (%): 20;30 Station: -3 Exam by:: Ulyses Southward, RN  Assessment / Plan:  Labor: IOL for PIH; Foley bulb removed Preeclampsia: CTO BP  Fetal Wellbeing: Cat II  Pain Control: Nubain and Phenergan were held d/t FHT I/D: GBS pos; SROM around 0000; Afebrile  Anticipated MOD: SVD   Raena Pau 08/08/2013, 3:07 AM

## 2013-08-08 NOTE — Progress Notes (Signed)
  Subjective: Pt is more comfortable now with UCs.  Objective: BP 128/90  Pulse 90  Temp(Src) 98.6 F (37 C) (Oral)  Resp 18  Ht 5\' 5"  (1.651 m)  Wt 211 lb (95.709 kg)  BMI 35.11 kg/m2  SpO2 98%      FHT:  Cat II UC:   irregular, every 7-8 minutes  SVE:   Dilation: 1 Effacement (%): 20;30 Station: -3 Exam by:: Ulyses Southward, RN  Assessment / Plan:  Labor: IOL for PIH; IUPC and FSE placed per Dr. Bing Ree request; Amnioinfusion started Preeclampsia: CTO BP  Fetal Wellbeing: Cat II  Pain Control: Epidural I/D: GBS pos; SROM around 0000; Afebrile  Anticipated MOD: SVD   Kristine Mason 08/08/2013, 6:17 AM

## 2013-08-08 NOTE — Anesthesia Preprocedure Evaluation (Addendum)
Anesthesia Evaluation  Patient identified by MRN, date of birth, ID band Patient awake    Reviewed: Allergy & Precautions, H&P , NPO status , Patient's Chart, lab work & pertinent test results, reviewed documented beta blocker date and time   History of Anesthesia Complications Negative for: history of anesthetic complications  Airway Mallampati: III TM Distance: >3 FB Neck ROM: full    Dental  (+) Teeth Intact   Pulmonary neg pulmonary ROS,  breath sounds clear to auscultation        Cardiovascular hypertension (PIH), Rhythm:regular Rate:Normal     Neuro/Psych negative neurological ROS  negative psych ROS   GI/Hepatic Neg liver ROS, GERD-  Medicated,  Endo/Other  Obese BMI 35.2  Renal/GU negative Renal ROS     Musculoskeletal   Abdominal   Peds  Hematology  (+) anemia ,   Anesthesia Other Findings   Reproductive/Obstetrics (+) Pregnancy                           Anesthesia Physical Anesthesia Plan  ASA: III and emergent  Anesthesia Plan: Epidural   Post-op Pain Management:    Induction:   Airway Management Planned:   Additional Equipment:   Intra-op Plan:   Post-operative Plan:   Informed Consent: I have reviewed the patients History and Physical, chart, labs and discussed the procedure including the risks, benefits and alternatives for the proposed anesthesia with the patient or authorized representative who has indicated his/her understanding and acceptance.     Plan Discussed with: Anesthesiologist, CRNA and Surgeon  Anesthesia Plan Comments: (Patient for urgent C/Section for non-reassuring FHR tracing. Will use epidural for anesthesia. )       Anesthesia Quick Evaluation

## 2013-08-09 ENCOUNTER — Encounter (HOSPITAL_COMMUNITY): Payer: Self-pay | Admitting: Obstetrics and Gynecology

## 2013-08-09 LAB — COMPREHENSIVE METABOLIC PANEL
Alkaline Phosphatase: 86 U/L (ref 39–117)
BUN: 8 mg/dL (ref 6–23)
CO2: 28 mEq/L (ref 19–32)
Chloride: 100 mEq/L (ref 96–112)
Creatinine, Ser: 0.84 mg/dL (ref 0.50–1.10)
GFR calc Af Amer: >90 mL/min (ref >90)
GFR calc non Af Amer: >90 mL/min (ref >90)
Glucose, Bld: 78 mg/dL (ref 70–99)
Potassium: 4.2 mEq/L (ref 3.5–5.1)
Total Bilirubin: 0.3 mg/dL (ref 0.3–1.2)

## 2013-08-09 LAB — PROTEIN / CREATININE RATIO, URINE: Total Protein, Urine: 4 mg/dL

## 2013-08-09 LAB — URIC ACID: Uric Acid, Serum: 4.1 mg/dL (ref 2.4–7.0)

## 2013-08-09 LAB — CBC
MCH: 25 pg — ABNORMAL LOW (ref 26.0–34.0)
MCHC: 31.9 g/dL (ref 30.0–36.0)
MCV: 78.3 fL (ref 78.0–100.0)
Platelets: 183 10*3/uL (ref 150–400)
RDW: 17.3 % — ABNORMAL HIGH (ref 11.5–15.5)

## 2013-08-09 LAB — LACTATE DEHYDROGENASE: LDH: 223 U/L (ref 94–250)

## 2013-08-09 NOTE — Progress Notes (Signed)
Subjective: Postpartum Day 1: Cesarean Delivery Patient reports no nausea, vomiting, incisional pain; is tolerating PO and no problems voiding.    Objective: Vital signs in last 24 hours: Temp:  [97.6 F (36.4 C)-98.8 F (37.1 C)] 97.6 F (36.4 C) (12/18 0951) Pulse Rate:  [71-113] 88 (12/18 1437) Resp:  [16-20] 16 (12/18 1437) BP: (116-135)/(57-91) 122/78 mmHg (12/18 1437) SpO2:  [96 %-100 %] 100 % (12/18 1437)  Physical Exam:  General: alert, cooperative and no distress Lungs clear.  Heart:  Regular, rate and rhythm Lochia: appropriate Uterine Fundus: firm Incision: healing well, no significant drainage, no significant erythema DVT Evaluation: No evidence of DVT seen on physical exam.   Recent Labs  08/08/13 0413 08/09/13 0555  HGB 11.7* 9.8*  HCT 35.3* 30.7*   Results for orders placed during the hospital encounter of 08/07/13 (from the past 24 hour(s))  CBC     Status: Abnormal   Collection Time    08/09/13  5:55 AM      Result Value Range   WBC 9.1  4.0 - 10.5 K/uL   RBC 3.92  3.87 - 5.11 MIL/uL   Hemoglobin 9.8 (*) 12.0 - 15.0 g/dL   HCT 29.5 (*) 62.1 - 30.8 %   MCV 78.3  78.0 - 100.0 fL   MCH 25.0 (*) 26.0 - 34.0 pg   MCHC 31.9  30.0 - 36.0 g/dL   RDW 65.7 (*) 84.6 - 96.2 %   Platelets 183  150 - 400 K/uL  COMPREHENSIVE METABOLIC PANEL     Status: Abnormal   Collection Time    08/09/13  5:55 AM      Result Value Range   Sodium 135  135 - 145 mEq/L   Potassium 4.2  3.5 - 5.1 mEq/L   Chloride 100  96 - 112 mEq/L   CO2 28  19 - 32 mEq/L   Glucose, Bld 78  70 - 99 mg/dL   BUN 8  6 - 23 mg/dL   Creatinine, Ser 9.52  0.50 - 1.10 mg/dL   Calcium 9.0  8.4 - 84.1 mg/dL   Total Protein 6.2  6.0 - 8.3 g/dL   Albumin 2.4 (*) 3.5 - 5.2 g/dL   AST 27  0 - 37 U/L   ALT 26  0 - 35 U/L   Alkaline Phosphatase 86  39 - 117 U/L   Total Bilirubin 0.3  0.3 - 1.2 mg/dL   GFR calc non Af Amer >90  >90 mL/min   GFR calc Af Amer >90  >90 mL/min  LACTATE DEHYDROGENASE      Status: None   Collection Time    08/09/13  5:55 AM      Result Value Range   LDH 223  94 - 250 U/L  URIC ACID     Status: None   Collection Time    08/09/13  5:55 AM      Result Value Range   Uric Acid, Serum 4.1  2.4 - 7.0 mg/dL  PROTEIN / CREATININE RATIO, URINE     Status: None   Collection Time    08/09/13 11:51 AM      Result Value Range   Creatinine, Urine 40.03     Total Protein, Urine 4     PROTEIN CREATININE RATIO 0.10  0.00 - 0.15    Assessment/Plan: Status post Cesarean section. Doing well postoperatively. No evidence of pre-eclampsia  Asymptomatic anemia Patient wants to plan for discharge on 08/10/2013 Currently undecided about  birth control  Kristine Mason 08/09/2013, 3:27 PM

## 2013-08-09 NOTE — Anesthesia Postprocedure Evaluation (Signed)
  Anesthesia Post-op Note  Anesthesia Post Note  Patient: Kristine Mason  Procedure(s) Performed: Procedure(s) (LRB): CESAREAN SECTION (N/A)  Anesthesia type: Epidural  Patient location: Mother/Baby  Post pain: Pain level controlled  Post assessment: Post-op Vital signs reviewed  Last Vitals:  Filed Vitals:   08/09/13 0606  BP: 126/79  Pulse: 74  Temp: 37.1 C  Resp: 16    Post vital signs: Reviewed  Level of consciousness:alert  Complications: No apparent anesthesia complications

## 2013-08-10 MED ORDER — NORETHINDRONE 0.35 MG PO TABS: 1.0000 | ORAL_TABLET | Freq: Every day | ORAL | Status: DC

## 2013-08-10 MED ORDER — OXYCODONE-ACETAMINOPHEN 5-325 MG PO TABS: 1.0000 | ORAL_TABLET | ORAL | Status: DC | PRN

## 2013-08-10 MED ORDER — IBUPROFEN 600 MG PO TABS: 600.0000 mg | ORAL_TABLET | Freq: Four times a day (QID) | ORAL | Status: DC

## 2013-08-10 NOTE — Discharge Summary (Signed)
  Obstetric Discharge Summary  Reason for Admission: induction of labor at 40+5 weeks for [facility] without pre-eclampsia Prenatal Procedures: none Intrapartum Procedures: cesarean: low cervical, transverse by Silverio Lay MD for Category 2 tracings Postpartum Procedures: none Complications-Operative and Postpartum: none  Hemoglobin  Date Value Range Status  08/09/2013 9.8* 12.0 - 15.0 g/dL Final     HCT  Date Value Range Status  08/09/2013 30.7* 36.0 - 46.0 % Final    Discharge Diagnoses: Term Pregnancy-delivered  Discharge Information:  Date: 08/10/2013 Activity: unrestricted Diet: routine Medications: Ibuprofen, Colace, Percocet and Micronor to start 08/25/13 Condition: stable  Breastfeeding: yes  Instructions: refer to practice specific booklet Discharge to: home   Newborn Data: Live born  Information for the patient's newborn:  Kristine Mason, Kristine Mason [[id number]]  female  [name] Home with mother.  Kristine Pfarr A MD 08/10/2013, 1:54 PM

## 2013-08-10 NOTE — Lactation Note (Signed)
This note was copied from the chart of [facility]ine [employer]. Lactation Consultation Note  Mom states baby is nursing well and cluster fed last night.  Nipples are sore and mom using EBM and comfort gels.  Reviewed basics and discharge teaching including engorgement treatment.  Answered questions and encouraged to call with concerns prn.  Patient Name: Kristine Mason Today's Date: 08/10/2013     Maternal Data    Feeding Feeding Type: Breast Fed Length of feed: 15 min  LATCH Score/Interventions                      Lactation Tools Discussed/Used     Consult Status      Hansel Feinstein 08/10/2013, 11:04 AM

## 2014-06-24 ENCOUNTER — Encounter (HOSPITAL_COMMUNITY): Payer: Self-pay | Admitting: Obstetrics and Gynecology

## 2014-11-06 ENCOUNTER — Ambulatory Visit (INDEPENDENT_AMBULATORY_CARE_PROVIDER_SITE_OTHER): Payer: 59 | Admitting: Family Medicine

## 2014-11-06 VITALS — BP 124/83 | HR 70 | Temp 98.1°F | Resp 17 | Ht 65.5 in | Wt 188.4 lb

## 2014-11-06 DIAGNOSIS — Z113 Encounter for screening for infections with a predominantly sexual mode of transmission: Secondary | ICD-10-CM

## 2014-11-06 DIAGNOSIS — Z0000 Encounter for general adult medical examination without abnormal findings: Secondary | ICD-10-CM | POA: Diagnosis not present

## 2014-11-06 DIAGNOSIS — Z13220 Encounter for screening for lipoid disorders: Secondary | ICD-10-CM | POA: Diagnosis not present

## 2014-11-06 DIAGNOSIS — E049 Nontoxic goiter, unspecified: Secondary | ICD-10-CM

## 2014-11-06 DIAGNOSIS — Z130 Encounter for screening for diseases of the blood and blood-forming organs and certain disorders involving the immune mechanism: Secondary | ICD-10-CM

## 2014-11-06 NOTE — Progress Notes (Signed)
Chief Complaint:  Chief Complaint  Patient presents with  . Annual Exam    HPI: Kristine Mason is a 29 y.o. female who is here for   Annual without Pap She is intermittently having sex without a condom with the same partner, they they are not wanting any more children but if they get pregnant and it does not appear to. She does not want to be on birth control. 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 She was referred to endocrinology but never went. This was due to having a newborn baby. She does not exhibit any thyroid storm symptoms, denies any chest pain, palpitations, fevers chills, nausea vomiting abdominal pain like cramps.   Past Medical History  Diagnosis Date  . Anemia   . Headache(784.0)   . Herpes genitalia   . Allergy    Past Surgical History  Procedure Laterality Date  . Abdominal surgery    . Cesarean section N/A 08/08/2013    Procedure: CESAREAN SECTION;  Surgeon: Esmeralda Arthur, MD;  Location: [facility];  Service: Obstetrics;  Laterality: N/A;   History   Social History  . Marital Status: Single    Spouse Name: N/A  . Number of Children: N/A  . Years of Education: N/A   Social History Main Topics  . Smoking status: Never Smoker   . Smokeless tobacco: Not on file  . Alcohol Use: No  . Drug Use: No  . Sexual Activity: Not on file   Other Topics Concern  . None   Social History Narrative   Family  History  Problem Relation Age of Onset  . Heart disease Mother   . Hypertension Maternal Aunt   . Hypertension Maternal Grandmother   . Heart disease Maternal Grandmother   . Mental illness Maternal Grandmother   . Diabetes Maternal Grandfather   . Mental illness Maternal Grandfather   . Heart disease Maternal Grandfather   . Stroke Maternal Grandfather   . Hypertension Maternal Grandfather   . Hyperlipidemia Maternal Grandfather   . Cancer Paternal Grandmother    Allergies  Allergen Reactions  . Cleocin [Clindamycin Hcl] Rash   Prior to Admission medications   Medication Sig Start Date End Date Taking? Authorizing Provider  valACYclovir (VALTREX) 500 MG tablet Take 500 mg by mouth as needed.   Yes Historical Provider, MD  ibuprofen (ADVIL,MOTRIN) 600 MG tablet Take 1 tablet (600 mg total) by mouth every 6 (six) hours. Patient not taking: Reported on 11/06/2014 08/10/13   Silverio Lay, MD  Iron-FA-B Cmp-C-Biot-Probiotic (FUSION PLUS PO) Take 1 tablet by mouth daily.    Historical Provider, MD  norethindrone (MICRONOR,CAMILA,ERRIN) 0.35 MG tablet Take 1 tablet (0.35 mg total) by mouth daily. To start Sunday 08/25/13 Patient not taking: Reported on 11/06/2014 08/10/13   Silverio Lay, MD  oxyCODONE-acetaminophen (PERCOCET/ROXICET) 5-325 MG per tablet Take 1-2 tablets by mouth every 4 (four) hours as needed for severe pain (moderate -  severe pain). Patient not taking: Reported on 11/06/2014 08/10/13   Silverio Lay, MD  Prenatal Vit-Min-FA-Fish Oil ([facility] PRENATAL GUMMY PO) Take 2 tablets by mouth daily.    Historical Provider, MD  ranitidine (ZANTAC) 75 MG tablet Take 75 mg by mouth daily as needed for heartburn.    Historical Provider, MD     ROS: The patient denies fevers, chills, night sweats, unintentional weight loss, chest pain, palpitations, wheezing, dyspnea on exertion, nausea, vomiting, abdominal pain, dysuria, hematuria, melena, numbness, weakness, or tingling.   All other  systems have been reviewed and were otherwise negative with the exception of those mentioned in the HPI and as above.    PHYSICAL EXAM: Filed Vitals:   11/06/14 1902  BP: 124/83  Pulse: 70  Temp: 98.1 F (36.7 C)  Resp: 17   Filed Vitals:   11/06/14 1902  Height: 5' 5.5" (1.664 m)  Weight: 188 lb 6.4 oz (85.458 kg)   Body mass index is 30.86 kg/(m^2).  General: Alert, no acute distress HEENT:  Normocephalic, atraumatic, oropharynx patent. EOMI, PERRLA, fundus exam normal, TMs normal, positive large thyroid goiter on the left side Cardiovascular:  Regular rate and rhythm, no rubs murmurs or gallops.  No Carotid bruits, radial pulse intact. No pedal edema.  Respiratory: Clear to auscultation bilaterally.  No wheezes, rales, or rhonchi.  No cyanosis, no use of accessory musculature GI: No organomegaly, abdomen is soft and non-tender, positive bowel sounds.  No masses. Skin: No rashes. Neurologic: Facial musculature symmetric. Psychiatric: Patient is appropriate throughout our interaction. Lymphatic: No cervical lymphadenopathy Musculoskeletal: Gait intact. 5 out of 5 strength, 2-2 DTRs Breast exam was unremarkable, no masses no lymphadenopathy  LABS: Results for orders placed or performed during the hospital encounter of 08/07/13  OB RESULT CONSOLE Group B Strep  Result Value Ref Range   GBS Positive   Protein / creatinine ratio, urine  Result Value Ref Range   Creatinine, Urine 86.81 mg/dL   Total Protein, Urine 18.8 mg/dL   Protein Creatinine Ratio 0.22 (H) 0.00 - 0.15  CBC  Result Value Ref Range   WBC 10.6 (H) 4.0 - 10.5 K/uL   RBC 4.56 3.87 - 5.11 MIL/uL   Hemoglobin 11.4 (L) 12.0 - 15.0 g/dL   HCT 16.1 (L) 09.6 - 04.5 %   MCV 76.8 (L) 78.0 - 100.0 fL   MCH 25.0 (L) 26.0 - 34.0 pg   MCHC 32.6 30.0 - 36.0 g/dL   RDW 40.9 (H) 81.1 - 91.4 %   Platelets 180 150 - 400 K/uL  Comprehensive metabolic panel  Result Value Ref Range   Sodium 134 (L) 135 - 145 mEq/L    Potassium 3.9 3.5 - 5.1 mEq/L   Chloride 102 96 - 112 mEq/L   CO2 20 19 - 32 mEq/L   Glucose, Bld 114 (H) 70 - 99 mg/dL   BUN 9 6 - 23 mg/dL   Creatinine, Ser 7.82 0.50 - 1.10 mg/dL   Calcium 8.9 8.4 - 95.6 mg/dL   Total Protein 6.4 6.0 - 8.3 g/dL   Albumin 2.7 (L) 3.5 - 5.2 g/dL   AST 18 0 - 37 U/L   ALT 22 0 - 35 U/L   Alkaline Phosphatase 97 39 - 117 U/L   Total Bilirubin 0.2 (L) 0.3 - 1.2 mg/dL   GFR calc non Af Amer >90 >90 mL/min   GFR calc Af Amer >90 >90 mL/min  Uric acid  Result Value Ref Range   Uric Acid,  Serum 3.6 2.4 - 7.0 mg/dL  Lactate dehydrogenase  Result Value Ref Range   LDH 180 94 - 250 U/L  OB RESULTS CONSOLE GC/Chlamydia  Result Value Ref Range   Gonorrhea Negative    Chlamydia Negative   OB RESULTS CONSOLE RPR  Result Value Ref Range   RPR Nonreactive   OB RESULTS CONSOLE HIV antibody  Result Value Ref Range   HIV Non-reactive   OB RESULTS CONSOLE Rubella Antibody  Result Value Ref Range   Rubella Immune   OB RESULTS CONSOLE Hepatitis B surface antigen  Result Value Ref Range   Hepatitis B Surface Ag Negative   CBC  Result Value Ref Range   WBC 10.2 4.0 - 10.5 K/uL   RBC 4.70 3.87 - 5.11 MIL/uL   Hemoglobin 11.7 (L) 12.0 - 15.0 g/dL   HCT 09.836.5 11.936.0 - 14.746.0 %   MCV 77.7 (L) 78.0 - 100.0 fL   MCH 24.9 (L) 26.0 - 34.0 pg   MCHC 32.1 30.0 - 36.0 g/dL   RDW 82.917.2 (H) 56.211.5 - 13.015.5 %   Platelets 206 150 - 400 K/uL  RPR  Result Value Ref Range   RPR Ser Ql NON REACTIVE NON REACTIVE  CBC  Result Value Ref Range   WBC 9.0 4.0 - 10.5 K/uL   RBC 4.56 3.87 - 5.11 MIL/uL   Hemoglobin 11.7 (L) 12.0 - 15.0 g/dL   HCT 86.535.3 (L) 78.436.0 - 69.646.0 %   MCV 77.4 (L) 78.0 - 100.0 fL   MCH 25.7 (L) 26.0 - 34.0 pg   MCHC 33.1 30.0 - 36.0 g/dL   RDW 29.517.2 (H) 28.411.5 - 13.215.5 %   Platelets 189 150 - 400 K/uL  CBC  Result Value Ref Range   WBC 9.1 4.0 - 10.5 K/uL   RBC 3.92 3.87 - 5.11 MIL/uL   Hemoglobin 9.8 (L) 12.0 - 15.0 g/dL   HCT 44.030.7 (L) 10.236.0 - 72.546.0 %   MCV  78.3 78.0 - 100.0 fL   MCH 25.0 (L) 26.0 - 34.0 pg   MCHC 31.9 30.0 - 36.0 g/dL   RDW 36.617.3 (H) 44.011.5 - 34.715.5 %   Platelets 183 150 - 400 K/uL  Comprehensive metabolic panel  Result Value Ref Range   Sodium 135 135 - 145 mEq/L   Potassium 4.2 3.5 - 5.1 mEq/L   Chloride 100 96 - 112 mEq/L   CO2 28 19 - 32 mEq/L   Glucose, Bld 78 70 - 99 mg/dL   BUN 8 6 - 23 mg/dL   Creatinine, Ser 4.250.84 0.50 - 1.10 mg/dL   Calcium 9.0 8.4 - 95.610.5 mg/dL   Total Protein 6.2 6.0 - 8.3 g/dL   Albumin 2.4 (L) 3.5 - 5.2 g/dL   AST 27 0 - 37 U/L   ALT 26 0 - 35 U/L   Alkaline Phosphatase 86 39 - 117 U/L   Total Bilirubin 0.3 0.3 - 1.2 mg/dL   GFR calc non Af Amer >90 >90 mL/min   GFR calc Af Amer >90 >90 mL/min  Lactate dehydrogenase  Result Value Ref Range   LDH 223 94 - 250 U/L  Uric acid  Result Value Ref Range   Uric Acid, Serum 4.1 2.4 - 7.0 mg/dL  Protein / creatinine ratio, urine  Result Value Ref Range   Creatinine, Urine 40.03 mg/dL   Total Protein, Urine 4 mg/dL   Protein Creatinine Ratio 0.10 0.00 - 0.15  OB RESULTS CONSOLE ABO/Rh  Result Value Ref Range  RH Type  Positive    ABO Grouping B   OB RESULTS CONSOLE Antibody Screen  Result Value Ref Range   Antibody Screen Negative   Type and screen  Result Value Ref Range   ABO/RH(D) B POS    Antibody Screen NEG    Sample Expiration 08/10/2013   ABO/Rh  Result Value Ref Range   ABO/RH(D) B POS      EKG/XRAY:   Primary read interpreted by [facility] at [facility].   ASSESSMENT/PLAN: Encounter Diagnoses  Name Primary?  . Annual physical exam Yes  . Screening for deficiency anemia   . Screening for hyperlipidemia   . Thyroid goiter   . Screening for STD (sexually transmitted disease)    Annual labs pending Defer Pap and pelvic exam due to currently being on menses, will return for a Pap at a later time Refer to endocrinology for thyroid, thyroid hormones pending She had urinated so was not able to keep me has urine specimen for  GC Follow-up as needed  Gross sideeffects, risk and benefits, and alternatives of medications d/w patient. Patient is aware that all medications have potential sideeffects and we are unable to predict every sideeffect or drug-drug interaction that may occur.  Hamilton Capri PHUONG, DO 11/06/2014 7:49 PM

## 2014-11-07 LAB — CBC WITH DIFFERENTIAL/PLATELET
Basophils Absolute: 0 10*3/uL (ref 0.0–0.1)
Basophils Relative: 0 % (ref 0–1)
Eosinophils Absolute: 0 10*3/uL (ref 0.0–0.7)
Eosinophils Relative: 1 % (ref 0–5)
HCT: 35.7 % — ABNORMAL LOW (ref 36.0–46.0)
Hemoglobin: 11.4 g/dL — ABNORMAL LOW (ref 12.0–15.0)
Lymphocytes Relative: 52 % — ABNORMAL HIGH (ref 12–46)
Lymphs Abs: 2.5 10*3/uL (ref 0.7–4.0)
MCH: 24.8 pg — ABNORMAL LOW (ref 26.0–34.0)
MCHC: 31.9 g/dL (ref 30.0–36.0)
MCV: 77.8 fL — ABNORMAL LOW (ref 78.0–100.0)
MPV: 9.2 fL (ref 8.6–12.4)
Monocytes Absolute: 0.3 10*3/uL (ref 0.1–1.0)
Monocytes Relative: 6 % (ref 3–12)
Neutro Abs: 2 10*3/uL (ref 1.7–7.7)
Neutrophils Relative %: 41 % — ABNORMAL LOW (ref 43–77)
Platelets: 323 10*3/uL (ref 150–400)
RBC: 4.59 MIL/uL (ref 3.87–5.11)
RDW: 13.7 % (ref 11.5–15.5)
WBC: 4.8 10*3/uL (ref 4.0–10.5)

## 2014-11-07 LAB — COMPLETE METABOLIC PANEL WITH GFR
AST: 14 U/L (ref 0–37)
Albumin: 4.3 g/dL (ref 3.5–5.2)
Alkaline Phosphatase: 89 U/L (ref 39–117)
Calcium: 9.9 mg/dL (ref 8.4–10.5)
Chloride: 103 mEq/L (ref 96–112)
Creat: 0.82 mg/dL (ref 0.50–1.10)
GFR, Est African American: >89 mL/min
GFR, Est Non African American: >89 mL/min
Glucose, Bld: 84 mg/dL (ref 70–99)
Potassium: 4.6 mEq/L (ref 3.5–5.3)
Sodium: 138 mEq/L (ref 135–145)
Total Bilirubin: 0.2 mg/dL (ref 0.2–1.2)

## 2014-11-07 LAB — COMPLETE METABOLIC PANEL WITHOUT GFR
ALT: 12 U/L (ref 0–35)
BUN: 12 mg/dL (ref 6–23)
CO2: 29 meq/L (ref 19–32)
Total Protein: 7.5 g/dL (ref 6.0–8.3)

## 2014-11-07 LAB — T3, FREE: T3, Free: 2.7 pg/mL (ref 2.3–4.2)

## 2014-11-07 LAB — HIV ANTIBODY (ROUTINE TESTING W REFLEX): HIV 1&2 Ab, 4th Generation: NONREACTIVE

## 2014-11-07 LAB — LDL CHOLESTEROL, DIRECT: Direct LDL: 91 mg/dL

## 2014-11-07 LAB — T4, FREE: Free T4: 0.98 ng/dL (ref 0.80–1.80)

## 2014-11-07 LAB — RPR

## 2014-11-07 LAB — TSH: TSH: 1.479 u[IU]/mL (ref 0.350–4.500)

## 2014-11-29 ENCOUNTER — Ambulatory Visit: Payer: Medicaid Other | Admitting: Internal Medicine

## 2014-12-27 ENCOUNTER — Encounter: Payer: Self-pay | Admitting: Internal Medicine

## 2014-12-27 ENCOUNTER — Ambulatory Visit (INDEPENDENT_AMBULATORY_CARE_PROVIDER_SITE_OTHER): Payer: 59 | Admitting: Internal Medicine

## 2014-12-27 VITALS — HR 94 | Temp 98.2°F | Resp 12 | Ht 65.5 in | Wt 191.6 lb

## 2014-12-27 DIAGNOSIS — E049 Nontoxic goiter, unspecified: Secondary | ICD-10-CM

## 2014-12-27 NOTE — Patient Instructions (Signed)
Please schedule an appt. in [facility] downstairs.  Please return in 1 year.  Thyroid Biopsy The thyroid gland is a butterfly-shaped gland situated in the front of the neck. It produces hormones which affect metabolism, growth and development, and body temperature. A thyroid biopsy is a procedure in which small samples of tissue or fluid are removed from the thyroid gland or mass and examined under a microscope. This test is done to determine the cause of thyroid problems, such as infection, cancer, or other thyroid problems. There are 2 ways to obtain samples: 1. Fine needle biopsy. Samples are removed using a thin needle inserted through the skin and into the thyroid gland or mass. 2. Open biopsy. Samples are removed after a cut (incision) is made through the skin. LET YOUR CAREGIVER KNOW ABOUT:   Allergies.  Medications taken including herbs, eye drops, over-the-counter medications, and creams.  Use of steroids (by mouth or creams).  Previous problems with anesthetics or numbing medicine.  Possibility of pregnancy, if this applies.  History of blood clots (thrombophlebitis).  History of bleeding or blood problems.  Previous surgery.  Other health problems. RISKS AND COMPLICATIONS  Bleeding from the site. The risk of bleeding is higher if you have a bleeding disorder or are taking any blood thinning medications (anticoagulants).  Infection.  Injury to structures near the thyroid gland. BEFORE THE PROCEDURE  This is a procedure that can be done as an outpatient. Confirm the time that you need to arrive for your procedure. Confirm whether there is a need to fast or withhold any medications. A blood sample may be done to determine your blood clotting time. Medicine may be given to help you relax (sedative). PROCEDURE Fine needle biopsy. You will be awake during the procedure. You may be asked to lie on your back with your head tipped backward to extend your neck. Let your  caregiver know if you cannot tolerate the positioning. An area on your neck will be cleansed. A needle is inserted through the skin of your neck. You may feel a mild discomfort during this procedure. You may be asked to avoid coughing, talking, swallowing, or making sounds during some portions of the procedure. The needle is withdrawn once tissue or fluid samples have been removed. Pressure may be applied to the neck to reduce swelling and ensure that bleeding has stopped. The samples will be sent for examination.  Open biopsy. You will be given general anesthesia. You will be asleep during the procedure. An incision is made in your neck. A sample of thyroid tissue or the mass is removed. The tissue sample or mass will be sent for examination. The sample or mass may be examined during the biopsy. If the sample or mass contains cancer cells, some or all of the thyroid gland may be removed. The incision is closed with stitches. AFTER THE PROCEDURE  Your recovery will be assessed and monitored. If there are no problems, as an outpatient, you should be able to go home shortly after the procedure. If you had a fine needle biopsy:  You may have soreness at the biopsy site for 1 to 2 days. If you had an open biopsy:   You may have soreness at the biopsy site for 3 to 4 days.  You may have a hoarse voice or sore throat for 1 to 2 days. Obtaining the Test Results It is your responsibility to obtain your test results. Do not assume everything is normal if you have not heard  from your caregiver or the medical facility. It is important for you to follow up on all of your test results. HOME CARE INSTRUCTIONS   Keeping your head raised on a pillow when you are lying down may ease biopsy site discomfort.  Supporting the back of your head and neck with both hands as you sit up from a lying position may ease biopsy site discomfort.  Only take over-the-counter or prescription medicines for pain, discomfort, or  fever as directed by your caregiver.  Throat lozenges or gargling with warm salt water may help to soothe a sore throat. SEEK IMMEDIATE MEDICAL CARE IF:   You have severe bleeding from the biopsy site.  You have difficulty swallowing.  You have a fever.  You have increased pain, swelling, redness, or warmth at the biopsy site.  You notice pus coming from the biopsy site.  You have swollen glands (lymph nodes) in your neck. Document Released: 06/06/2007 Document Revised: 12/04/2012 Document Reviewed: 11/01/2013 [facility] Patient Information 2015 [employer] [place]. This information is not intended to replace advice given to you by your health care provider. Make sure you discuss any questions you have with your health care provider.

## 2014-12-27 NOTE — Progress Notes (Addendum)
Patient ID: Kristine PilgrimAmber Juneau, female   DOB: February 16, 1986, 29 y.o.   MRN: [id number]   HPI  Kristine Mason is a 29 y.o.-year-old female, referred by her PCP, Dr. Conley RollsLe, for evaluation for goiter.  She noticed the goiter in 2014 >> saw her Dr >> TFTs normal. She feels her goiter being a little larger now.   I reviewed pt's thyroid tests: Lab Results  Component Value Date   TSH 1.479 11/06/2014   FREET4 0.98 11/06/2014    Pt does notice nodules in neck, but no hoarseness, dysphagia/odynophagia, SOB with lying down.  Pt denies: - heat intolerance/cold intolerance - tremors - palpitations - anxiety/depression - hyperdefecation/constipation - dry skin - hair falling  She mentions: - weight gain - fatigue - HA - blurry vision   Pt does not have a FH of thyroid ds. No FH of thyroid cancer. No h/o radiation tx to head or neck.  No seaweed or kelp, no recent contrast studies. No steroid use. No herbal supplements.   I reviewed her chart and she also has a history of anemia.  ROS: Constitutional: + see HPI Eyes: + blurry vision, no xerophthalmia ENT: + see HPI Cardiovascular: no CP/SOB/palpitations/leg swelling Respiratory: no cough/SOB Gastrointestinal: no N/V/D/C Musculoskeletal: no muscle/joint aches Skin: no rashes Neurological: no tremors/numbness/tingling/dizziness, + HA Psychiatric: no depression/anxiety + Low libido  Past Medical History  Diagnosis Date  . Anemia   . Headache(784.0)   . Herpes genitalia   . Allergy    Past Surgical History  Procedure Laterality Date  . Abdominal surgery    . Cesarean section N/A 08/08/2013    Procedure: CESAREAN SECTION;  Surgeon: Esmeralda ArthurSandra A Rivard, MD;  Location: [facility];  Service: Obstetrics;  Laterality: N/A;   History   Social History  . Marital Status: Single    Spouse Name: N/A  . Number of Children: 1   Occupational History  . adjuster   Social History Main Topics  . Smoking status: Never Smoker   . Smokeless tobacco: Not  on file  . Alcohol Use: No  . Drug Use: No   Current Outpatient Prescriptions on File Prior to Visit  Medication Sig Dispense Refill  . Iron-FA-B Cmp-C-Biot-Probiotic (FUSION PLUS PO) Take 1 tablet by mouth daily.    . Prenatal Vit-Min-FA-Fish Oil ([facility]S PRENATAL GUMMY PO) Take 2 tablets by mouth daily.    . ranitidine (ZANTAC) 75 MG tablet Take 75 mg by mouth daily as needed for heartburn.    . valACYclovir (VALTREX) 500 MG tablet Take 500 mg by mouth as needed.     No current facility-administered medications on file prior to visit.   Allergies  Allergen Reactions  . Cleocin [Clindamycin Hcl] Rash    PE: Pulse 94  Temp(Src) 98.2 F (36.8 C) (Oral)  Resp 12  Ht 5' 5.5" (1.664 m)  Wt 191 lb 9.6 oz (86.909 kg)  BMI 31.39 kg/m2  SpO2 98% Wt Readings from Last 3 Encounters:  12/27/14 191 lb 9.6 oz (86.909 kg)  11/06/14 188 lb 6.4 oz (85.458 kg)  08/07/13 211 lb (95.709 kg)   Constitutional: overweight, in NAD Eyes: PERRLA, EOMI, no exophthalmos ENT: moist mucous membranes, + thyromegaly - isthmic large nodule, no cervical lymphadenopathy Cardiovascular: RRR, No MRG Respiratory: CTA B Gastrointestinal: abdomen soft, NT, ND, BS+ Musculoskeletal: no deformities, strength intact in all 4;  Skin: moist, warm, no rashes Neurological: no tremor with outstretched hands, DTR normal in all 4  ASSESSMENT: 1. Goiter  PLAN: 1. Goiter -  pt with a large goiter by palpation, without neck compression sxs - she does not have a thyroid cancer family history or a personal history of RxTx to head/neck  - no imaging available - I suggested to first check a thyroid U/S to see if she has a uniform goiter or a MNG - we discussed about the fact that if she has a nodule >> may need a thyroid Bx - given info about thyroid Bx - I explained that this is not cancer, we can continue to follow her on a yearly basis, and check another ultrasound in another year or 2. - she should let me know if she  develops neck compression symptoms, in that case, we might need to do either lobectomy or thyroidectomy - discussed the very good prognosis of ThyCa, esp. in young pts - I'll see her back in a year   [facility]   Right thyroid lobe:  44.4 x 1.4 x 1.6 cm   Isthmus 0.5 cm   Left thyroid lobe: 7.4 x 3.6 x 5.4 cm.  There is a  Heterogeneous solid nodule occupying almost the entirety of the left thyroid lobe, measuring 6.6 x 3.4 x 5.1 cm. This masslike enlargement of the left thyroid lobe displaces the trachea to the right.  Per my discussion with the patient at last visit, we will pursue biopsy of the large thyroid nodule.  Adequacy Reason Satisfactory For Evaluation. Diagnosis THYROID, LEFT, FINE NEEDLE ASPIRATION (SPECIMEN 1 OF 1, COLLECTED ON 01/29/2015): FINDINGS CONSISTENT WITH BENIGN THYROID NODULE (BETHESDA CATEGORY II). Zandra AbtsOBERT HILLARD MD Pathologist, Electronic Signature (Case signed 01/30/2015) Specimen Clinical Information 6.6 x 3.4 x 5.1 cm Left Lobe Thyroid Nodule seen on outside imaging [facility]-No Report Available Source Thyroid, Fine Needle Aspiration, Left Lobe (Specimen 1 of 1, collected 01/29/15)  Benign pathology! Since no neck compression sxs >> surgery not indicated.

## 2015-01-20 IMAGING — US US FETAL BPP W/O NONSTRESS
1 series · 10 of 10 positions shown · non-contrast
Comparison: none

[Series 1: us fetal bpp w/o nonstress · non-contrast · 10 acquisitions, 10 frames shown]
[im 1/10]
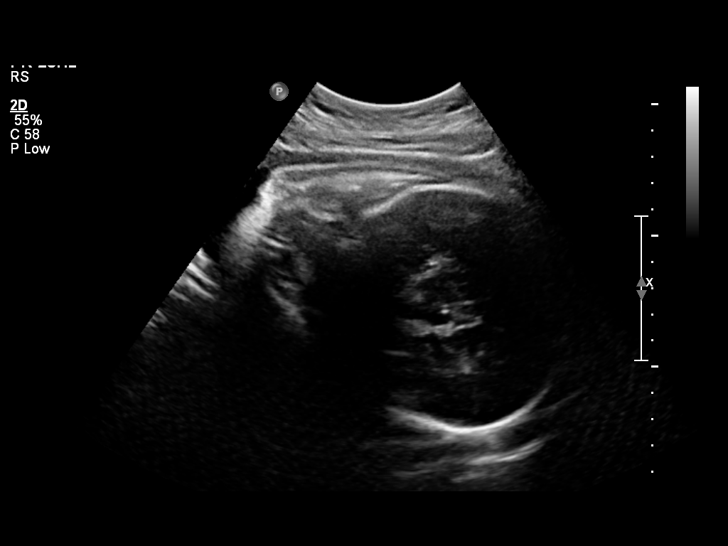
[im 2/10]
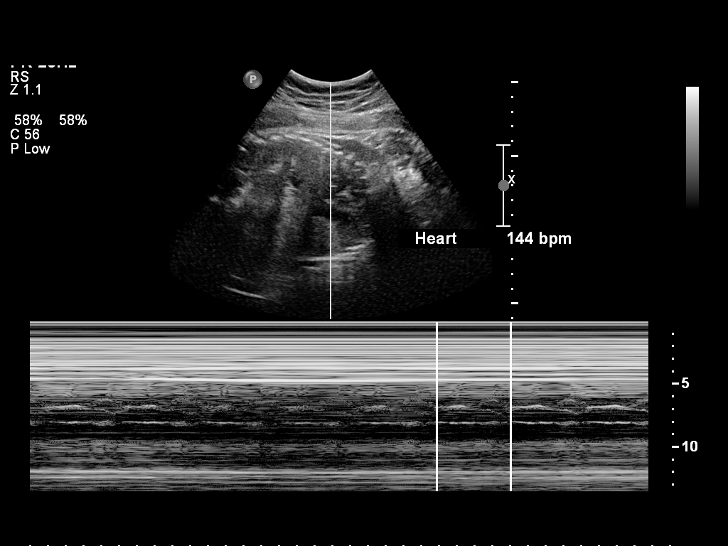
[im 3/10]
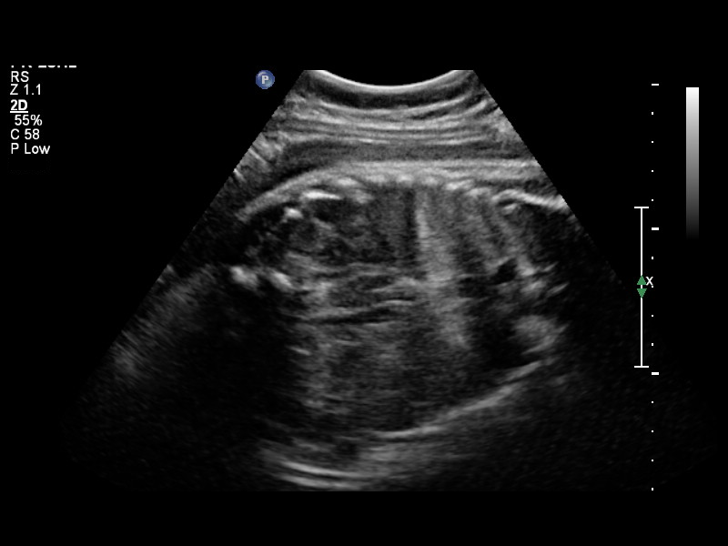
[im 4/10]
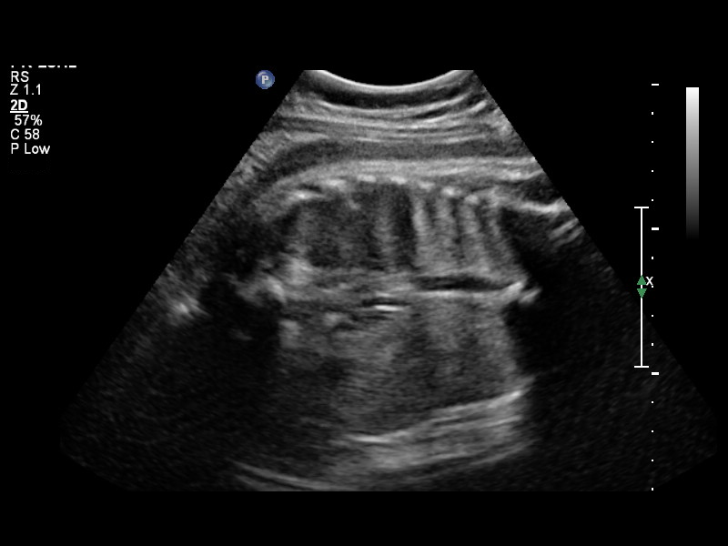
[im 5/10]
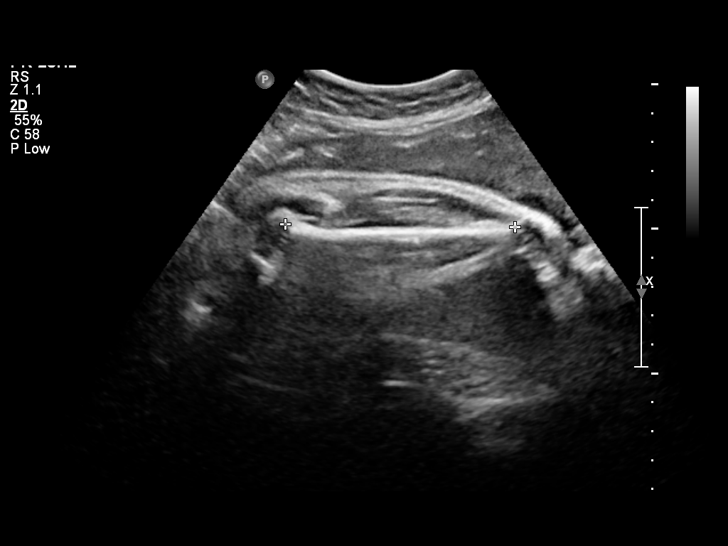
[im 6/10]
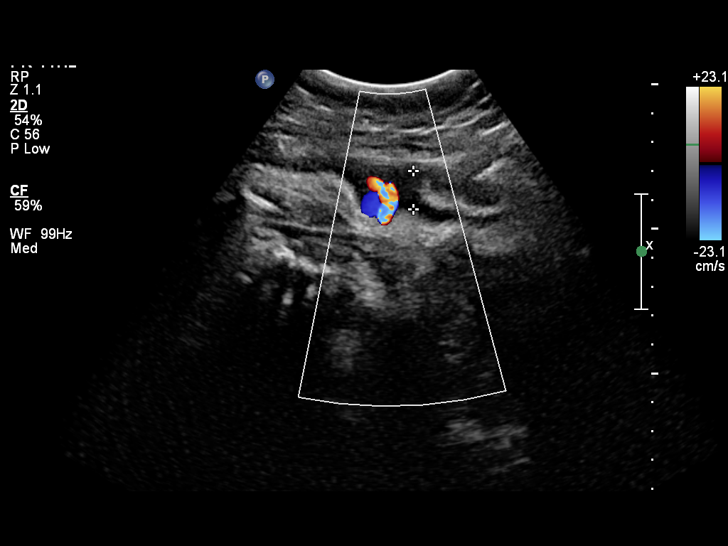
[im 7/10]
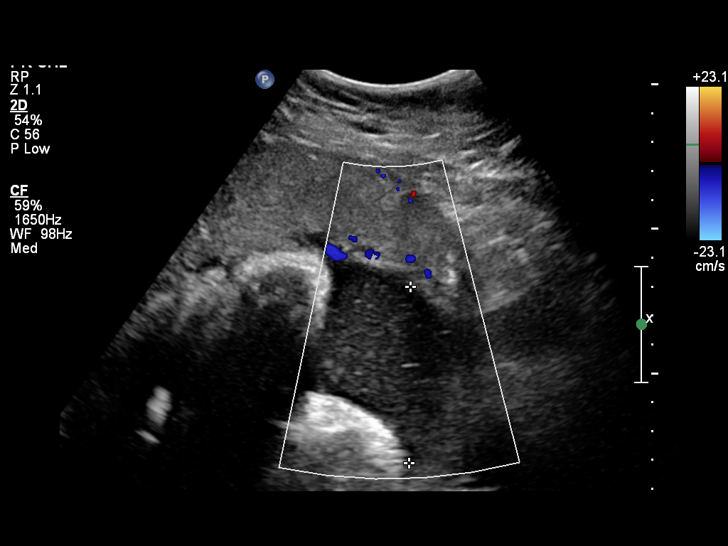
[im 8/10]
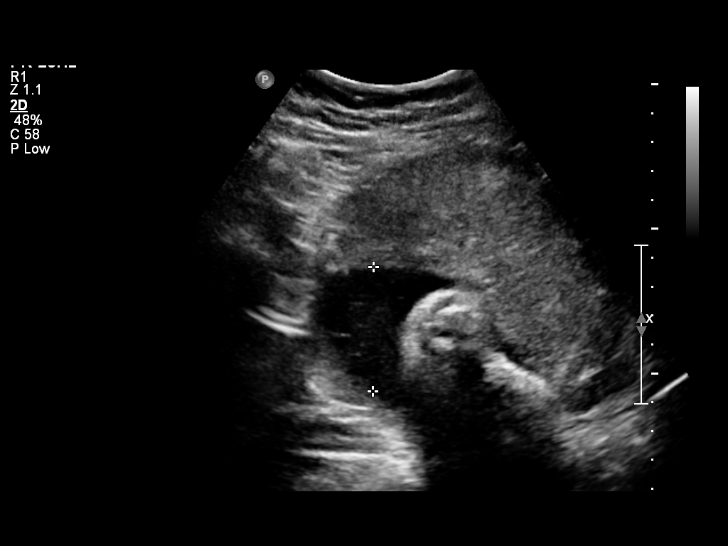
[im 9/10]
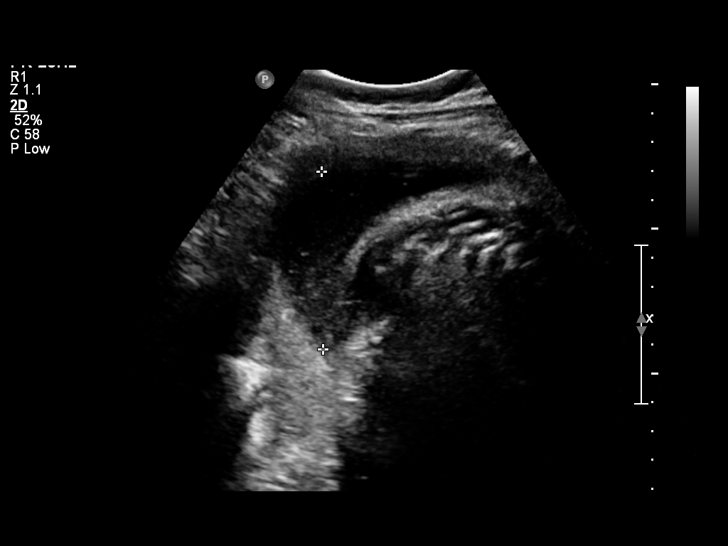
[im 10/10]
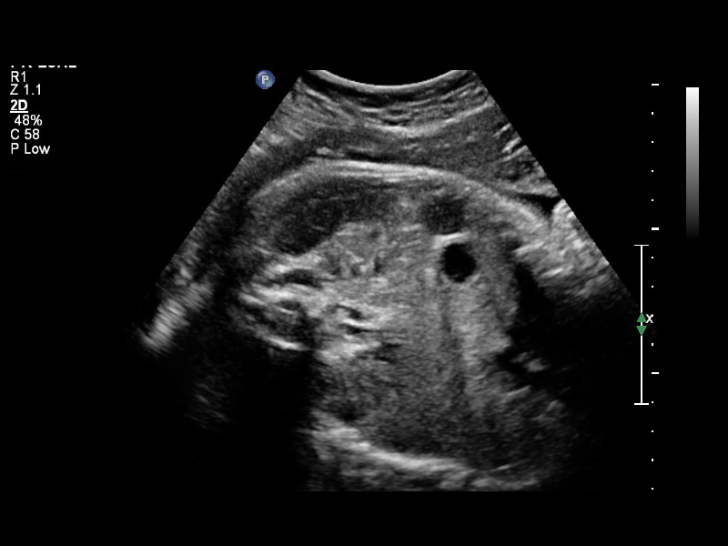

[10 of 10 positions shown; findings below may reference images not displayed]

OBSTETRICS REPORT
                      (Signed Final 08/07/2013 [DATE])

Service(s) Provided

 [HOSPITAL]                                         76815.0
Indications

 Non-reactive NST
 Postdate pregnancy (40-42 weeks)
Fetal Evaluation

 Num Of Fetuses:    1
 Fetal Heart Rate:  144                          bpm
 Cardiac Activity:  Observed
 Presentation:      Cephalic
 Placenta:          Anterior, above cervical os

 Amniotic Fluid
 AFI FV:      Subjectively within normal limits
 AFI Sum:     17.82   cm       83  %Tile     Larg Pckt:    6.13  cm
 RUQ:   6.13    cm   RLQ:    1.32   cm    LUQ:   4.29    cm   LLQ:    6.08   cm
Biophysical Evaluation

 Amniotic F.V:   Within normal limits       F. Tone:        Observed
 F. Movement:    Observed                   Score:          [DATE]
 F. Breathing:   Observed
Biometry

 FL:      79.2  mm     G. Age:  40w 4d
Gestational Age

 Clinical EDD:  40w 5d                                        EDD:   08/02/13
 U/S Today:     40w 4d                                        EDD:   08/03/13
 Best:          40w 5d     Det. By:  Clinical EDD             EDD:   08/02/13
Cervix Uterus Adnexa

 Cervix:       Not visualized (advanced GA >34 wks)
 Left Ovary:    Not visualized.
 Right Ovary:   Not visualized.
Impression

 Single IUP at 40 [DATE] weeks
 Active fetus with BPP of [DATE]
 Normal amniotic fluid volume (AFI = 17.8 cm)
Recommendations

 Follow-up ultrasounds as clinically indicated.

 questions or concerns.

## 2015-01-28 ENCOUNTER — Other Ambulatory Visit: Payer: Self-pay | Admitting: *Deleted

## 2015-01-28 ENCOUNTER — Telehealth: Payer: Self-pay | Admitting: *Deleted

## 2015-01-28 ENCOUNTER — Telehealth: Payer: Self-pay | Admitting: Internal Medicine

## 2015-01-28 NOTE — Addendum Note (Signed)
Addended by: Carlus PavlovGHERGHE, Liron Eissler on: 01/28/2015 08:22 AM   Modules accepted: Orders, Level of Service

## 2015-01-28 NOTE — Telephone Encounter (Signed)
Called pt and lvm advising her to that we have her thyroid ultrasound results and to please return my call.

## 2015-01-28 NOTE — Telephone Encounter (Signed)
Pt called returning RewShannon phone call, pt is at work please call her back at 1pm

## 2015-01-28 NOTE — Telephone Encounter (Signed)
Called pt and advised her per Dr Charlean SanfilippoGherghe's message concerning pt's thyroid U/S. Pt voiced understanding. Pt stated that [facility] has already called, but they do not have an appt for at least a month. Pt wants to know if it is ok to go to [facility] to have the bx done? Pt will call and see if they have availability sooner. Be advised.

## 2015-01-29 ENCOUNTER — Ambulatory Visit
Admission: RE | Admit: 2015-01-29 | Discharge: 2015-01-29 | Disposition: A | Discharge status: Home / Self Care | Payer: 59 | Source: Ambulatory Visit | Attending: Internal Medicine | Admitting: Internal Medicine

## 2015-01-29 ENCOUNTER — Other Ambulatory Visit (HOSPITAL_COMMUNITY)
Admission: RE | Admit: 2015-01-29 | Discharge: 2015-01-29 | Disposition: A | Discharge status: Home / Self Care | Payer: 59 | Source: Ambulatory Visit | Attending: Interventional Radiology | Admitting: Interventional Radiology

## 2015-01-29 DIAGNOSIS — E041 Nontoxic single thyroid nodule: Secondary | ICD-10-CM | POA: Diagnosis not present

## 2015-01-30 NOTE — Addendum Note (Signed)
Addended by: Carlus Pavlov on: 01/30/2015 04:40 PM   Modules accepted: Level of Service

## 2015-02-03 NOTE — Telephone Encounter (Signed)
Needed to see Dr.

## 2015-02-25 ENCOUNTER — Other Ambulatory Visit: Payer: 59

## 2016-01-02 ENCOUNTER — Ambulatory Visit: Payer: 59 | Admitting: Internal Medicine

## 2016-04-08 ENCOUNTER — Ambulatory Visit: Payer: 59 | Admitting: Internal Medicine

## 2016-06-24 ENCOUNTER — Ambulatory Visit: Payer: 59 | Admitting: Internal Medicine

## 2016-07-13 IMAGING — US US THYROID BIOPSY
1 series · 14 of 14 positions shown · non-contrast
Comparison: none

CLINICAL DATA: Dominant left thyroid mass

EXAM:
ULTRASOUND-GUIDED THYROID ASPIRATION BIOPSY
TECHNIQUE: The procedure, risks (including but not limited to bleeding,
infection, organ damage ), benefits, and alternatives were explained
to the patient. Questions regarding the procedure were encouraged
and answered. The patient understands and consents to the procedure.

[Series 1: us thyroid biopsy · 0.09mm/px · 14 acquisitions, 14 frames shown]
[im 1/14]
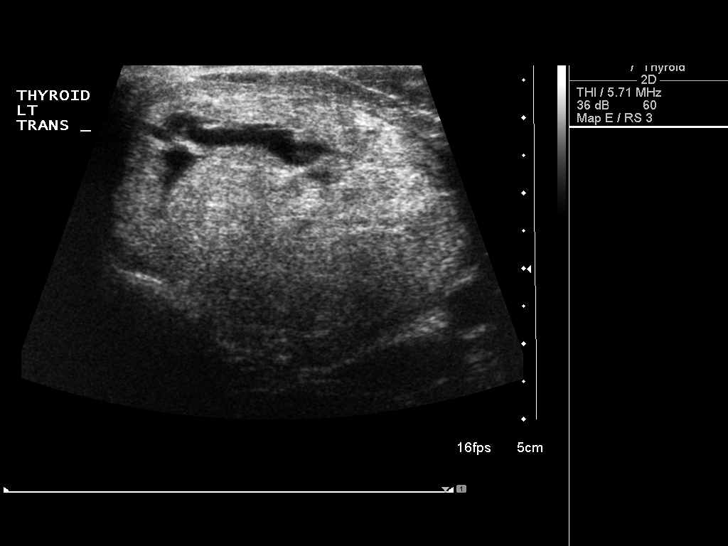
[im 2/14]
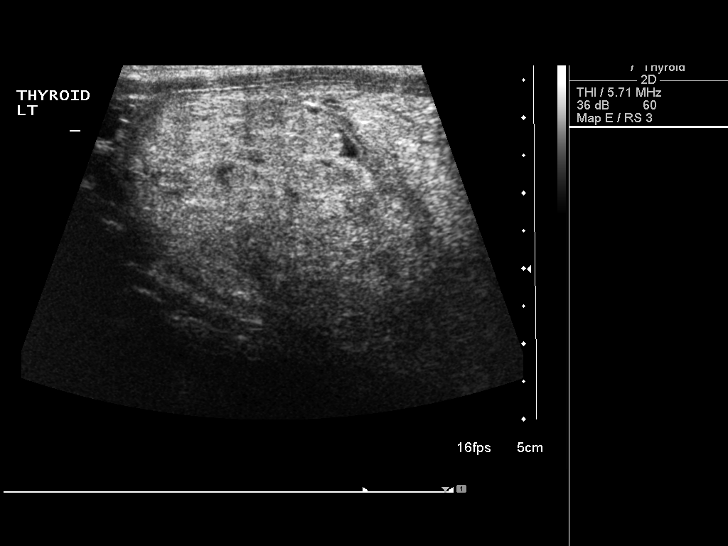
[im 3/14]
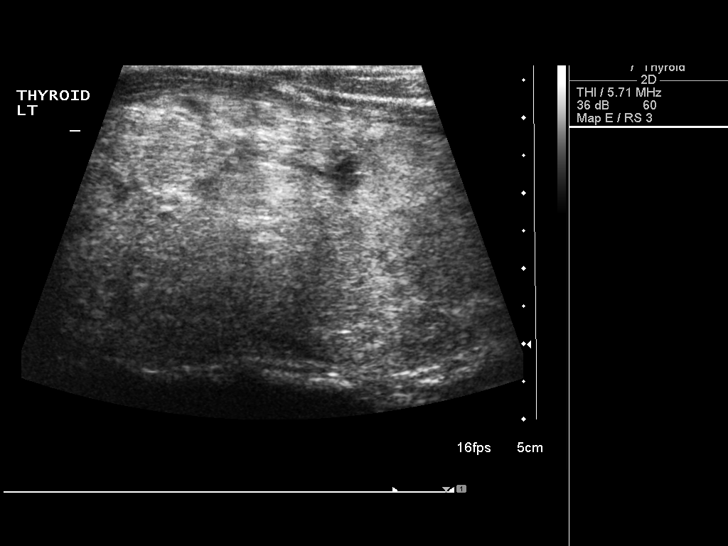
[im 4/14]
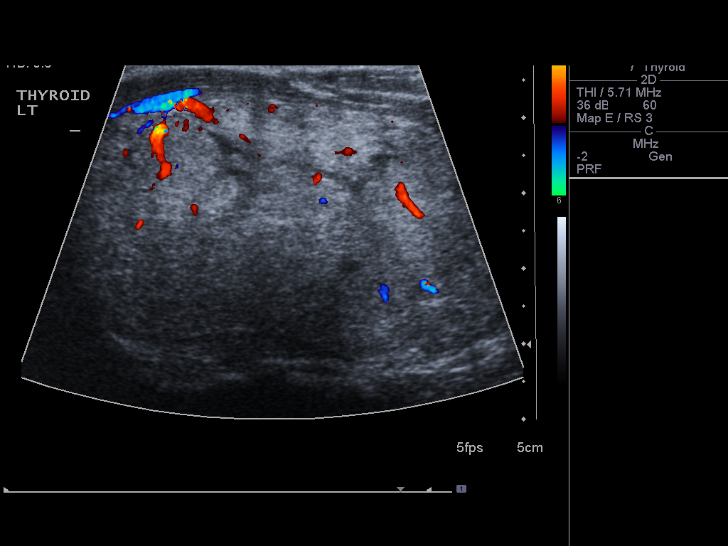
[im 5/14]
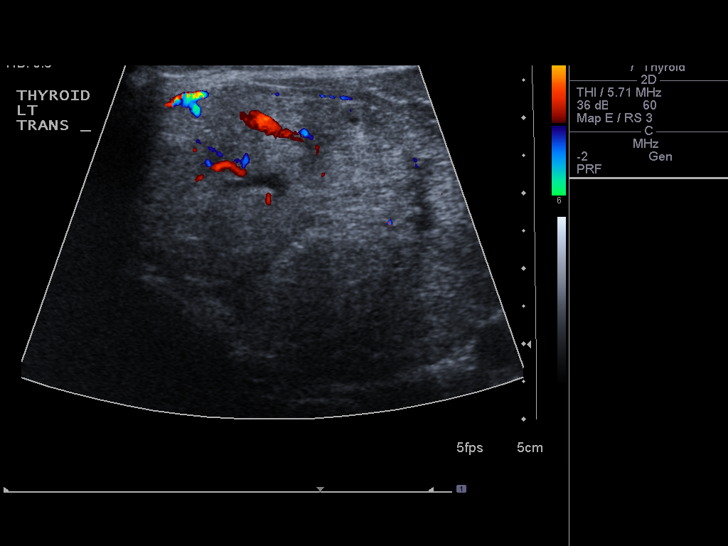
[im 6/14]
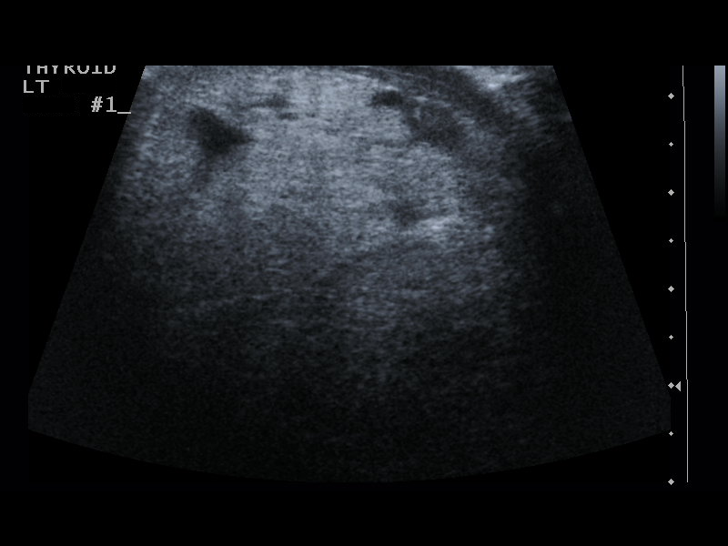
[im 7/14]
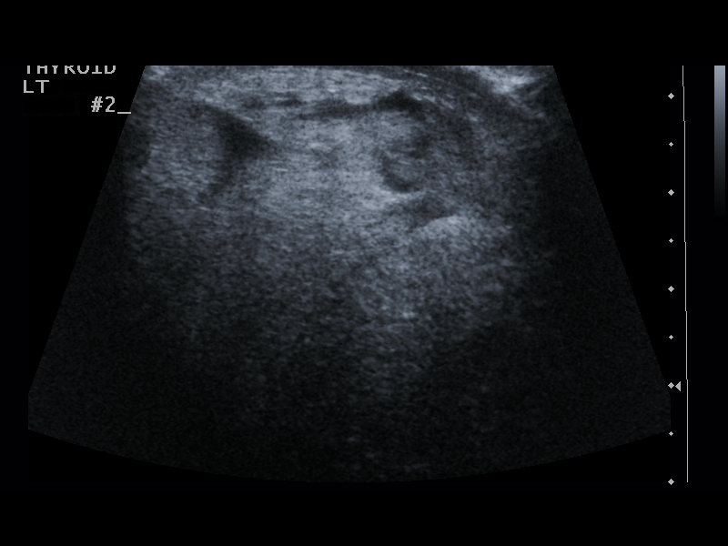
[im 8/14]
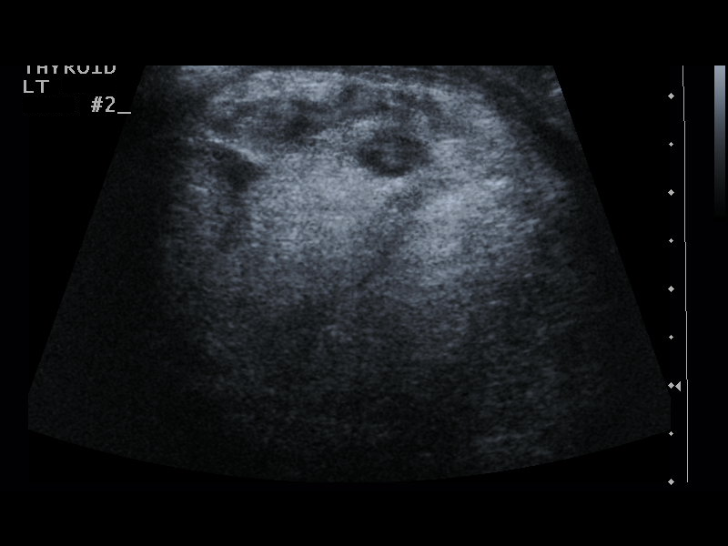
[im 9/14]
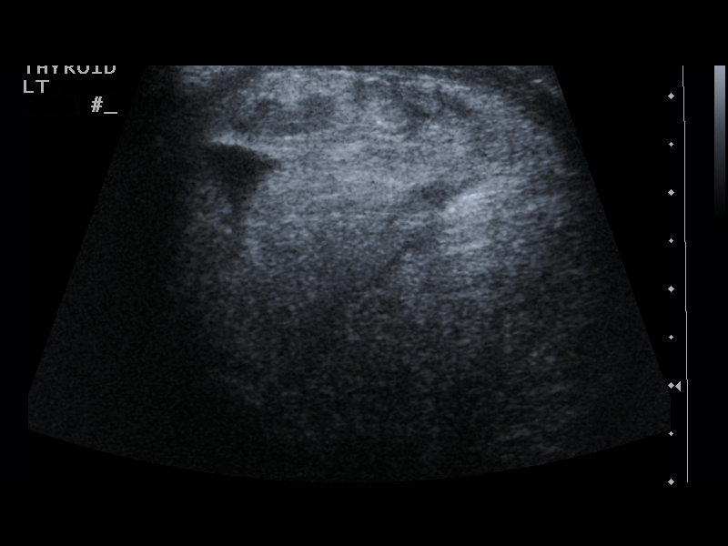
[im 10/14]
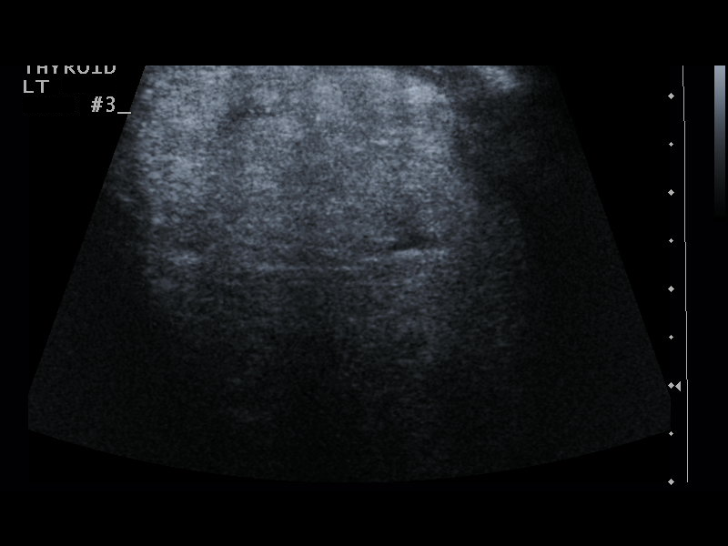
[im 11/14]
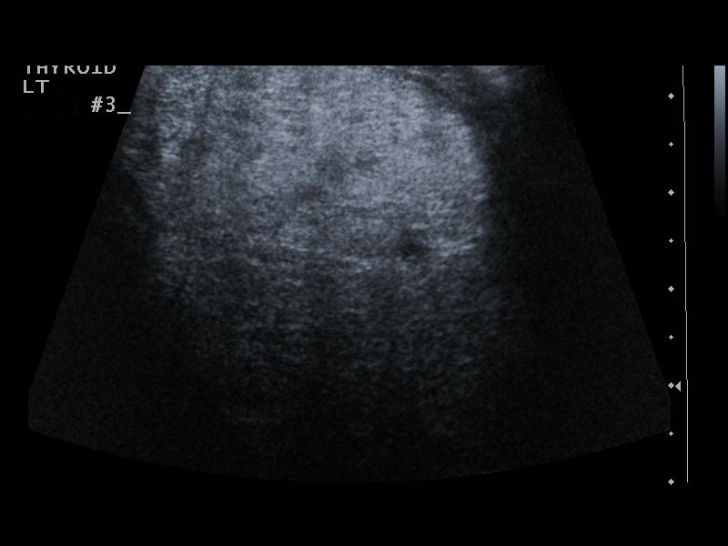
[im 12/14]
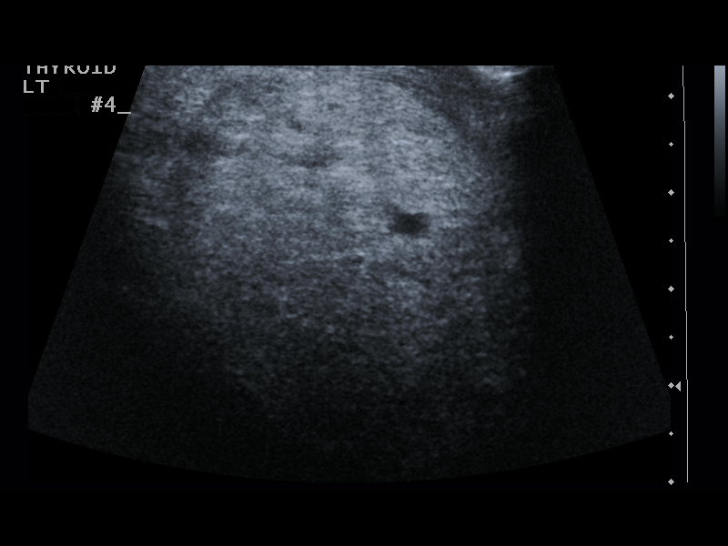
[im 13/14]
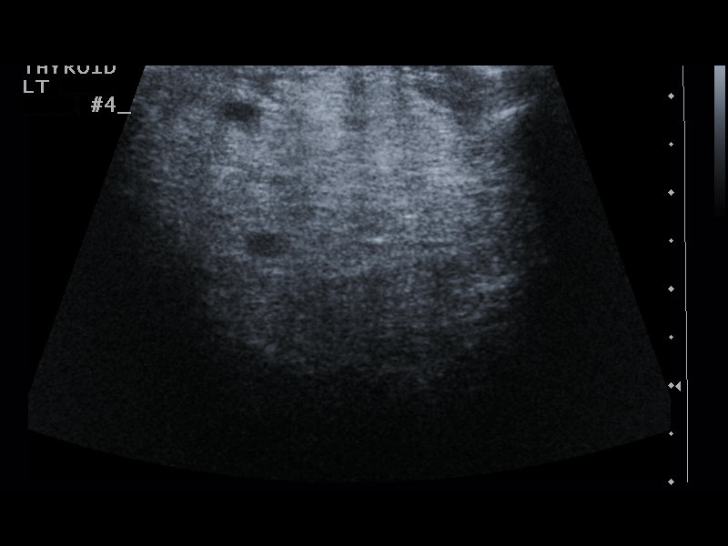
[im 14/14]
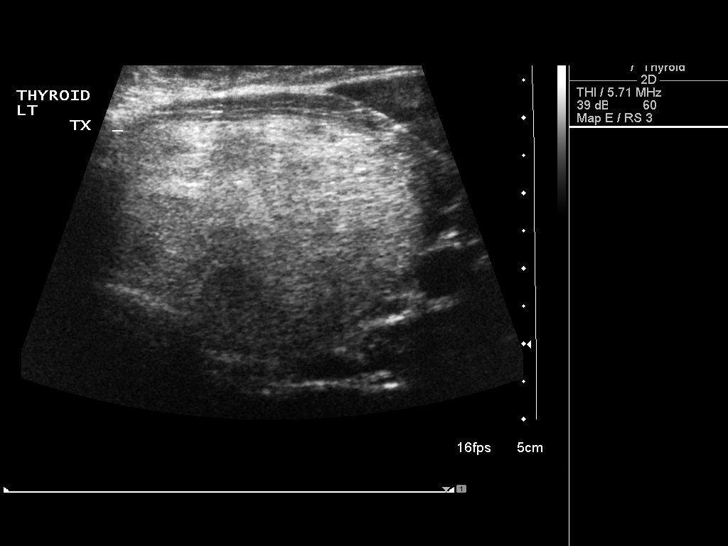

[14 of 14 positions shown; findings below may reference images not displayed]

Survey ultrasound was performed and the dominant lesion in the left
lobe was localized. An appropriate skin entry site was determined.
Skin was marked, then prepped with Betadine, draped in usual sterile
fashion, and infiltrated locally with 1% lidocaine. Under real-time
ultrasound guidance, 4 passes were made into the lesion with 25
gauge needles. The patient tolerated procedure well.

COMPLICATIONS:
COMPLICATIONS
none
IMPRESSION: 1. Technically successful ultrasound-guided thyroid aspiration
biopsy, dominant left nodule.

## 2016-08-06 ENCOUNTER — Ambulatory Visit
Admission: RE | Admit: 2016-08-06 | Discharge: 2016-08-06 | Disposition: A | Discharge status: Home / Self Care | Payer: BLUE CROSS/BLUE SHIELD | Source: Ambulatory Visit | Attending: Internal Medicine | Admitting: Internal Medicine

## 2016-08-06 ENCOUNTER — Encounter: Payer: Self-pay | Admitting: Internal Medicine

## 2016-08-06 ENCOUNTER — Ambulatory Visit: Payer: 59 | Admitting: Internal Medicine

## 2016-08-06 ENCOUNTER — Ambulatory Visit (INDEPENDENT_AMBULATORY_CARE_PROVIDER_SITE_OTHER): Payer: 59 | Admitting: Internal Medicine

## 2016-08-06 VITALS — BP 114/64 | HR 85 | Ht 65.0 in | Wt 201.0 lb

## 2016-08-06 DIAGNOSIS — E049 Nontoxic goiter, unspecified: Secondary | ICD-10-CM | POA: Diagnosis not present

## 2016-08-06 NOTE — Patient Instructions (Addendum)
Please check if you can have the thyroid U/S today.  Please return in 1 year.

## 2016-08-06 NOTE — Progress Notes (Signed)
Patient ID: Kristine Mason, female   DOB: 1986-04-30, 30 y.o.   MRN: [id number]   HPI  Kristine Mason is a 30 y.o.-year-old female, returning for f/u for goiter. Last visit 1.5 years ago. [place] moved to [place] since then.  Reviewed and addended hx: She noticed the goiter in 2014 >> saw her Dr >> TFTs normal. Before our last visit >> noticed enlargement of her neck.   Thyroid ultrasound from [facility] (01/24/2015):   Right thyroid lobe:  44.4 x 1.4 x 1.6 cm   Isthmus 0.5 cm   Left thyroid lobe: 7.4 x 3.6 x 5.4 cm.  There is a  Heterogeneous solid nodule occupying almost the entirety of the left thyroid lobe, measuring 6.6 x 3.4 x 5.1 cm.  This masslike enlargement of the left thyroid lobe displaces the trachea to the right.  Biopsy of the large thyroid nodule (01/29/2015): Adequacy Reason Satisfactory For Evaluation. Diagnosis THYROID, LEFT, FINE NEEDLE ASPIRATION (SPECIMEN 1 OF 1, COLLECTED ON 01/29/2015): FINDINGS CONSISTENT WITH BENIGN THYROID NODULE (BETHESDA CATEGORY II).  Based on the above report >> surgery was not indicated. We decided to follow her with serial U/S and also to continue to monitor her clinically.  I reviewed pt's thyroid tests: Lab Results  Component Value Date   TSH 1.479 11/06/2014   FREET4 0.98 11/06/2014    Pt does notice her neck being larger, but no hoarseness, dysphagia/odynophagia, SOB with lying down.  Pt denies: - heat intolerance/cold intolerance - tremors - palpitations - anxiety/depression - hyperdefecation/constipation - dry skin - hair loss  She mentions: - + weight gain - 10 lbs since last visit - + fatigue - + HA  Pt does not have a FH of thyroid ds. No FH of thyroid cancer. No h/o radiation tx to head or neck.  No seaweed or kelp, no recent contrast studies. No steroid use. No herbal supplements.    ROS: Constitutional: + see HPI Eyes: no blurry vision, no xerophthalmia ENT: + see HPI Cardiovascular: no  CP/SOB/palpitations/leg swelling Respiratory: no cough/SOB Gastrointestinal: no N/V/D/C Musculoskeletal: no muscle/joint aches Skin: no rashes Neurological: no tremors/numbness/tingling/dizziness, + HA  I reviewed pt's medications, allergies, PMH, social hx, family hx, and changes were documented in the history of present illness. Otherwise, unchanged from my initial visit note.  Past Medical History:  Diagnosis Date  . Allergy   . Anemia   . Headache(784.0)   . Herpes genitalia    Past Surgical History:  Procedure Laterality Date  . ABDOMINAL SURGERY    . CESAREAN SECTION N/A 08/08/2013   Procedure: CESAREAN SECTION;  Surgeon: Esmeralda ArthurSandra A Rivard, MD;  Location: [facility];  Service: Obstetrics;  Laterality: N/A;   History   Social History  . Marital Status: Single    Spouse Name: N/A  . Number of Children: 1   Occupational History  . adjuster   Social History Main Topics  . Smoking status: Never Smoker   . Smokeless tobacco: Not on file  . Alcohol Use: No  . Drug Use: No   Current Outpatient Prescriptions on File Prior to Visit  Medication Sig Dispense Refill  . Iron-FA-B Cmp-C-Biot-Probiotic (FUSION PLUS PO) Take 1 tablet by mouth daily.    . Prenatal Vit-Min-FA-Fish Oil ([employer] PRENATAL GUMMY PO) Take 2 tablets by mouth daily.    . ranitidine (ZANTAC) 75 MG tablet Take 75 mg by mouth daily as needed for heartburn.    . valACYclovir (VALTREX) 500 MG tablet Take 500 mg by mouth as  needed.     No current facility-administered medications on file prior to visit.    Allergies  Allergen Reactions  . Cleocin [Clindamycin Hcl] Rash   PE: Ht 5\' 5"  (1.651 m)   Wt 201 lb (91.2 kg)   BMI 33.45 kg/m  Wt Readings from Last 3 Encounters:  08/06/16 201 lb (91.2 kg)  12/27/14 191 lb 9.6 oz (86.9 kg)  11/06/14 188 lb 6.4 oz (85.5 kg)   Constitutional: overweight, in NAD Eyes: PERRLA, EOMI, no exophthalmos ENT: moist mucous membranes, + thyromegaly - isthmic large nodule, no  cervical lymphadenopathy Cardiovascular: RRR, No MRG Respiratory: CTA B Gastrointestinal: abdomen soft, NT, ND, BS+ Musculoskeletal: no deformities, strength intact in all 4;  Skin: moist, warm, no rashes Neurological: no tremor with outstretched hands, DTR normal in all 4  ASSESSMENT: 1. Goiter  PLAN: 1. Goiter - pt with a large goiter by palpation, without neck compression sxs. On last U/S from 2016 >> there was a large L thyroid nodule, 6.6 cm in largest dimension. We biopsied this nodule >> benign. - she does not have a thyroid cancer family history or a personal history of RxTx to head/neck  - it is interesting that she does not have any neck compression sxs from the nodule >> in this case, no intervention is needed - I suggested to check another thyroid U/S now, 1.5 yrs from the previous >> would suggest hemithyroidectomy if the nodule grew - explained that she will have a risk of developing hypothyroidism of ~20% after the sx.  - she should let me know if she develops neck compression symptoms, which would be another indication for lobectomy - I'll see her back in a year  US SOFT TISSUE HEAD AND NECK  Order: [id number]  Status:  Final result Visible to patient:  No (Not Released) Dx:  Enlarged thyroid  Details   Reading Physician Reading Date Result Priority  Oley Balmaniel Hassell, MD 08/07/2016   Narrative    CLINICAL DATA: Left nodule, post FNA biopsy 01/29/2015  EXAM: THYROID ULTRASOUND  TECHNIQUE: Ultrasound examination of the thyroid gland and adjacent soft tissues was performed.  COMPARISON: Biopsy images 01/29/2015  FINDINGS: Parenchymal Echotexture: Mildly heterogenous _________________________________________________________  Isthmus: 0.6 cm thickness, previously 0.5  No discrete nodules are identified within the thyroid isthmus.  _________________________________________________________  Right lobe: 4.3 x 1.3 x 1.8 cm (previously 4.4 x 1.4 x 1.6  cm)  No discrete nodules are identified within the right lobe of the thyroid.  _________________________________________________________  Left lobe: 8.7 x 4.2 x 4.7 cm (previously 6.6 x 3.4 x 5.1 cm)  Nodule # 1:  Location: Left; Mid  Size: 7.4 x 3.5 x 5.3 cm  Composition: solid/almost completely solid (2)  Echogenicity: cannot determine (1)  ACR TI-RADS risk category: TR3 (3 points).  ACR TI-RADS recommendations:  **Given size (>/= 2.5 cm) and appearance, fine needle aspiration of this mildly suspicious nodule should be considered based on TI-RADS criteria.  Note: This has already been performed.  IMPRESSION: 1. Persistence solitary 7.4 cm left thyroid mass, meets criteria for biopsy, which was performed, reported consistent with benign thyroid nodule (Bethesda category 2).  The above is in keeping with the ACR TI-RADS recommendations - J Am Coll Radiol 2017;14:587-595.   Electronically Signed By: Corlis Leak Hassell M.D. On: 08/07/2016 10:19               Nodule is larger, however, since the biopsy was benign and she does not have any neck compression symptoms, I  will recheck the ultrasound next visit. If the nodule continues to grow, I would suggest hemithyroidectomy.  Carlus Pavlov, MD PhD [facility] Endocrinology

## 2017-08-12 ENCOUNTER — Ambulatory Visit: Payer: BLUE CROSS/BLUE SHIELD | Admitting: Internal Medicine

## 2017-09-16 ENCOUNTER — Ambulatory Visit: Payer: BLUE CROSS/BLUE SHIELD | Admitting: Internal Medicine

## 2017-09-20 ENCOUNTER — Telehealth: Payer: Self-pay | Admitting: Internal Medicine

## 2017-09-20 NOTE — Telephone Encounter (Signed)
Patient cancelled her appointment but she knows Dr. Elvera LennoxGherghe will want patient to have a biopsy and ultrasound. Patient requests that biopsy and ultrasound be referred to [place] in [place]. [place] to [place]. Patient will return to see Dr Elvera LennoxGherghe for her follow up of the above tests. If questions please call patient at ph# [phone]. Patient has always come for her 1 year follow ups

## 2017-09-20 NOTE — Telephone Encounter (Signed)
I cannot refer [place] to [place], since I do not know providers there.  May be her PCP can do that.  We do not need a biopsy for now, only a thyroid ultrasound.  I saw she canceled m several appointments... I can see her after she has the ultrasound, if she prefers.

## 2017-09-20 NOTE — Telephone Encounter (Signed)
Please advise on below  

## 2017-09-21 NOTE — Telephone Encounter (Signed)
Mailbox is full unable to LVM

## 2017-09-23 ENCOUNTER — Telehealth: Payer: Self-pay | Admitting: Internal Medicine

## 2017-09-23 ENCOUNTER — Other Ambulatory Visit: Payer: Self-pay | Admitting: Internal Medicine

## 2017-09-23 DIAGNOSIS — E049 Nontoxic goiter, unspecified: Secondary | ICD-10-CM

## 2017-09-23 NOTE — Telephone Encounter (Signed)
Patient requests that the order for the Ultrasound be sent to the [facility] downstairs on 1st floor. Please call patient at ph# [phone] to update status

## 2017-09-23 NOTE — Telephone Encounter (Signed)
Pt is aware.  

## 2017-09-23 NOTE — Telephone Encounter (Signed)
Pt returning call

## 2017-09-23 NOTE — Telephone Encounter (Signed)
Pt is aware and stated she will get the US done and come and follow up here 12/09/17. She is also aware she has canceled many appointments. States she will be at this appointment to review the [place]

## 2017-09-23 NOTE — Telephone Encounter (Signed)
Done

## 2017-09-23 NOTE — Telephone Encounter (Signed)
Please advise on below  

## 2017-10-21 ENCOUNTER — Other Ambulatory Visit: Payer: BLUE CROSS/BLUE SHIELD

## 2017-12-09 ENCOUNTER — Ambulatory Visit: Payer: Self-pay | Admitting: Internal Medicine

## 2018-01-19 IMAGING — US US SOFT TISSUE HEAD/NECK
1 series · 13 of 25 positions shown · non-contrast
Comparison: Biopsy images  01/29/2015

CLINICAL DATA: Left nodule, post FNA biopsy 01/29/2015

EXAM:
THYROID ULTRASOUND
TECHNIQUE: Ultrasound examination of the thyroid gland and adjacent soft
tissues was performed.

[Series 1: us soft tissue head/neck · 0.09mm/px · 13 of 44 slices shown]
[im 1/44]
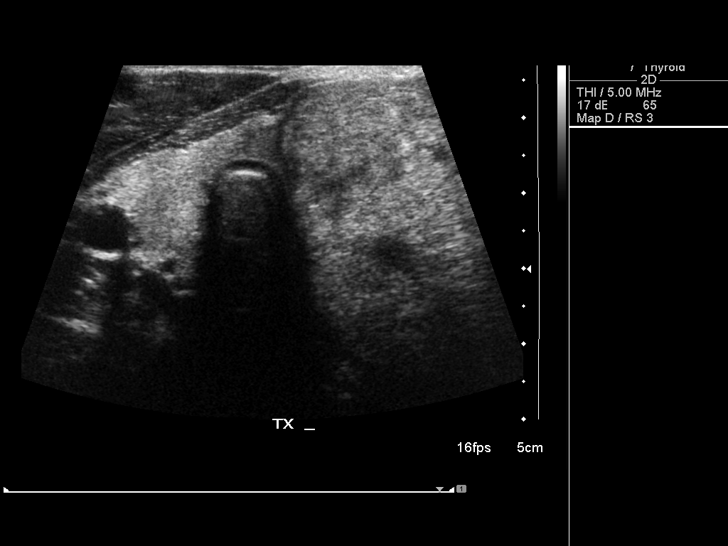
[im 4/44]
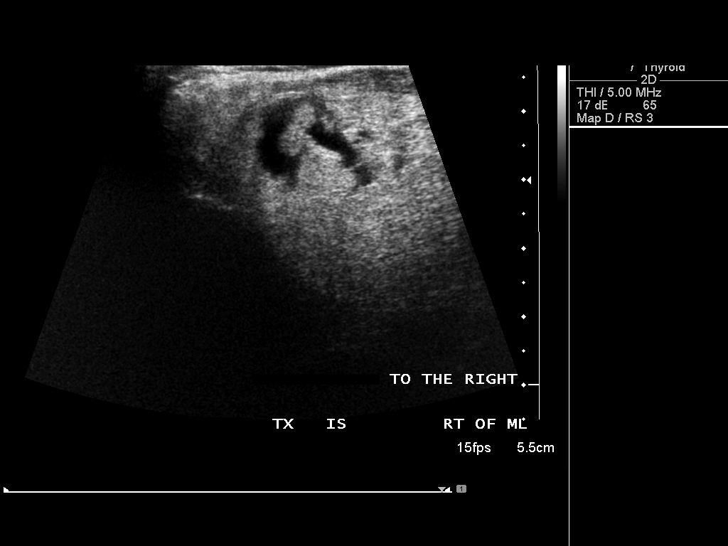
[im 8/44]
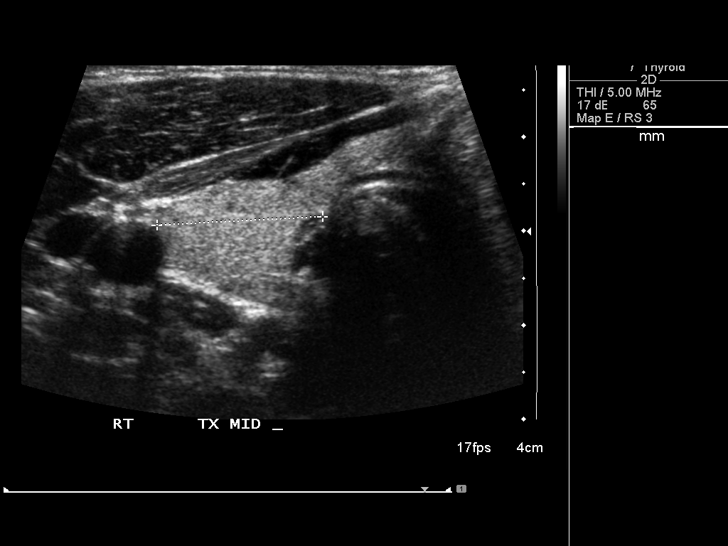
[im 11/44]
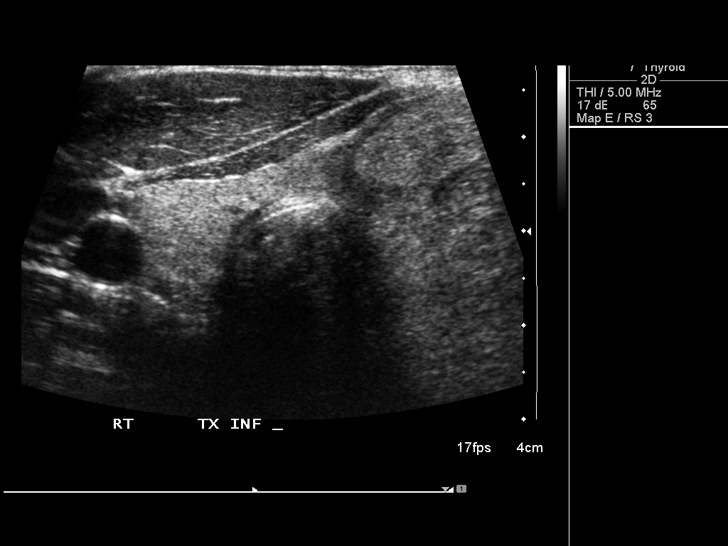
[im 15/44]
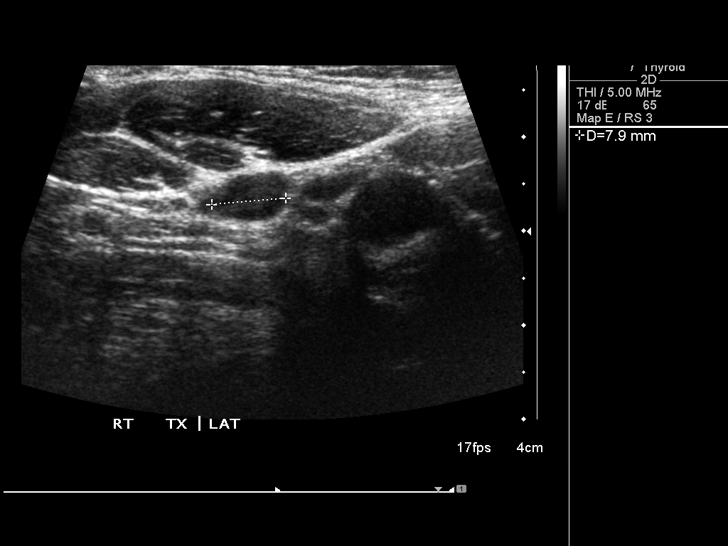
[im 18/44]
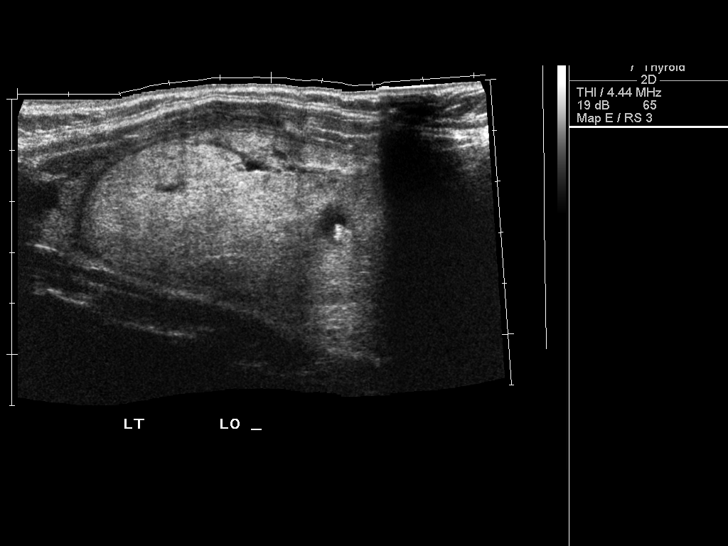
[im 22/44]
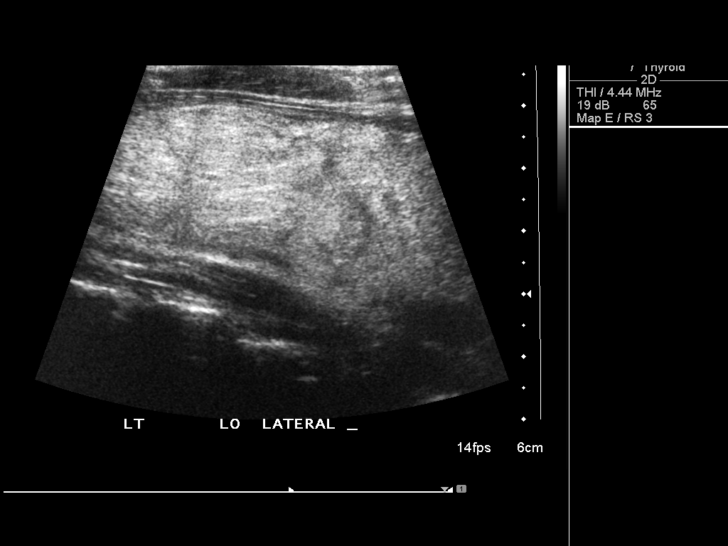
[im 26/44]
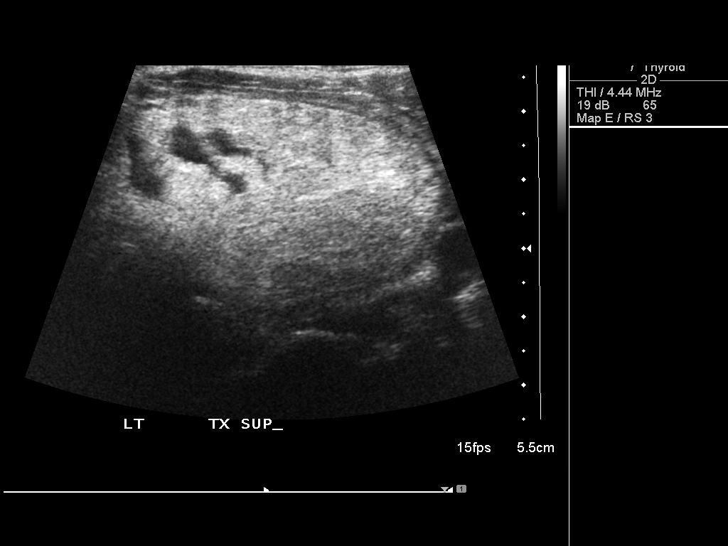
[im 29/44]
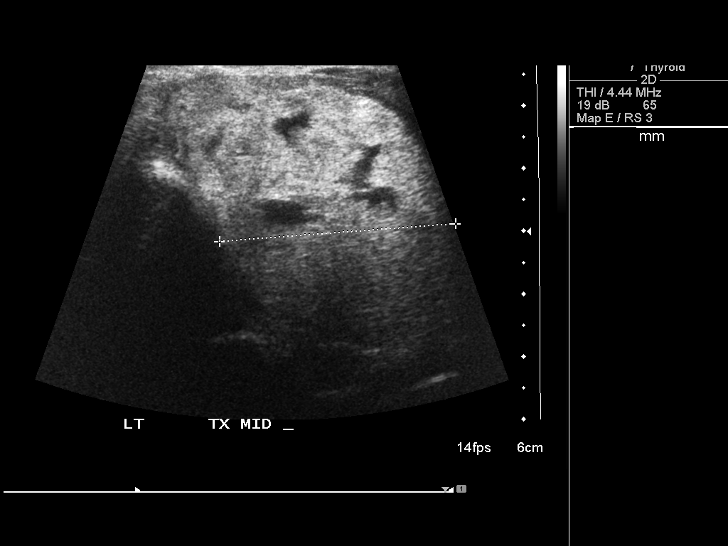
[im 33/44]
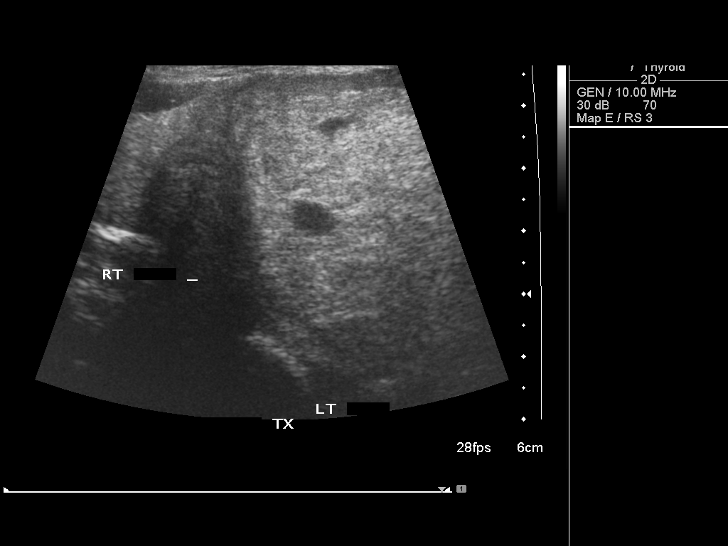
[im 36/44]
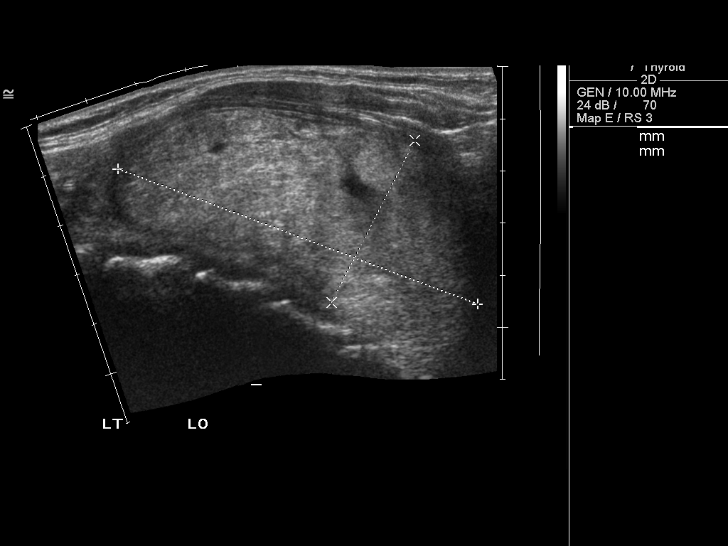
[im 40/44]
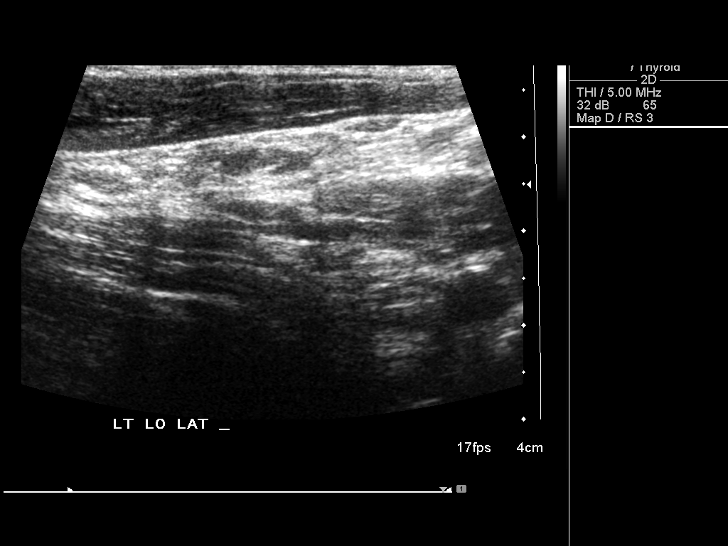
[im 44/44]
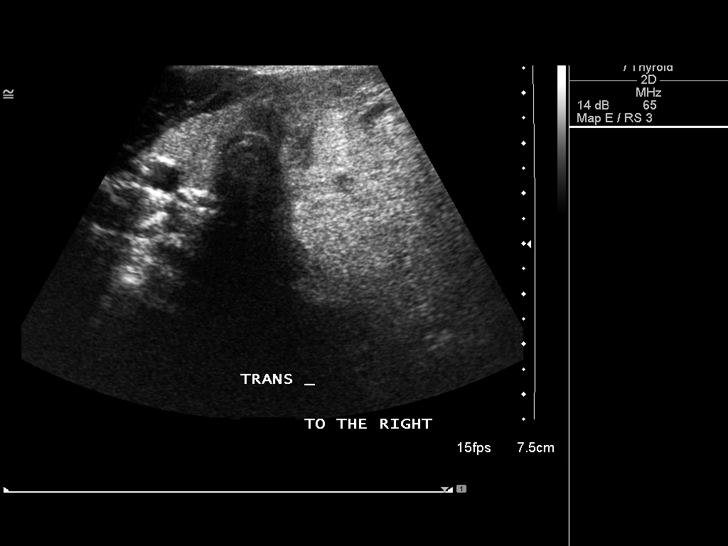

[13 of 25 positions shown; findings below may reference images not displayed]

FINDINGS: Parenchymal Echotexture: Mildly heterogenous

Estimated total number of nodules >/= 1 cm: 1

Number of spongiform nodules >/=  2 cm not described below (TR1): 0

Number of mixed cystic and solid nodules >/= 1.5 cm not described
below (TR2): 0

_________________________________________________________

Isthmus: 0.6 cm thickness, previously

No discrete nodules are identified within the thyroid isthmus.

_________________________________________________________

Right lobe: 4.3 x 1.3 x 1.8 cm (previously 4.4 x 1.4 x 1.6 cm)

No discrete nodules are identified within the right lobe of the
thyroid.

_________________________________________________________

Left lobe: 8.7 x 4.2 x 4.7 cm (previously 6.6 x 3.4 x 5.1 cm)

Nodule # 1:

Location: Left; Mid

Size: 7.4 x 3.5 x 5.3 cm

Composition: solid/almost completely solid (2)

Echogenicity: cannot determine (1)

Shape: not taller-than-wide (0)

Margins: smooth (0)

Echogenic foci: none (0)

ACR TI-RADS total points: 3.

ACR TI-RADS risk category: TR3 (3 points).

ACR TI-RADS recommendations:

**Given size (>/= 2.5 cm) and appearance, fine needle aspiration of
this mildly suspicious nodule should be considered based on TI-RADS
criteria.

Note:  This has already been performed.
IMPRESSION: 1. Persistence solitary 7.4 cm left thyroid mass, meets criteria for
biopsy, which was performed, reported consistent with benign thyroid
nodule ([REDACTED] category 2).

The above is in keeping with the ACR TI-RADS recommendations - [HOSPITAL] 6868;[DATE].
